# Patient Record
Sex: Female | Born: 1960
Health system: Southern US, Community
[De-identification: ages and names within clinical notes are randomized; demographics above are authoritative.]

## PROBLEM LIST (undated history)

## (undated) DIAGNOSIS — Z0000 Encounter for general adult medical examination without abnormal findings: Secondary | ICD-10-CM

## (undated) DIAGNOSIS — F32A Depression, unspecified: Secondary | ICD-10-CM

## (undated) DIAGNOSIS — F329 Major depressive disorder, single episode, unspecified: Secondary | ICD-10-CM

## (undated) DIAGNOSIS — IMO0002 Reserved for concepts with insufficient information to code with codable children: Secondary | ICD-10-CM

## (undated) DIAGNOSIS — Z86010 Personal history of colonic polyps: Secondary | ICD-10-CM

## (undated) DIAGNOSIS — D649 Anemia, unspecified: Secondary | ICD-10-CM

## (undated) DIAGNOSIS — E039 Hypothyroidism, unspecified: Secondary | ICD-10-CM

## (undated) DIAGNOSIS — M545 Low back pain: Secondary | ICD-10-CM

## (undated) DIAGNOSIS — J45909 Unspecified asthma, uncomplicated: Secondary | ICD-10-CM

## (undated) DIAGNOSIS — I10 Essential (primary) hypertension: Secondary | ICD-10-CM

## (undated) DIAGNOSIS — N870 Mild cervical dysplasia: Secondary | ICD-10-CM

## (undated) DIAGNOSIS — R430 Anosmia: Secondary | ICD-10-CM

## (undated) DIAGNOSIS — E785 Hyperlipidemia, unspecified: Secondary | ICD-10-CM

## (undated) DIAGNOSIS — M26609 Unspecified temporomandibular joint disorder, unspecified side: Secondary | ICD-10-CM

## (undated) HISTORY — DX: Depression, unspecified: F32.A

## (undated) HISTORY — DX: Anosmia: R43.0

## (undated) HISTORY — DX: Reserved for concepts with insufficient information to code with codable children: IMO0002

## (undated) HISTORY — DX: Encounter for general adult medical examination without abnormal findings: Z00.00

## (undated) HISTORY — PX: TONSILLECTOMY AND ADENOIDECTOMY: SUR1326

## (undated) HISTORY — DX: Anemia, unspecified: D64.9

## (undated) HISTORY — DX: Essential (primary) hypertension: I10

## (undated) HISTORY — DX: Unspecified temporomandibular joint disorder, unspecified side: M26.609

## (undated) HISTORY — DX: Low back pain: M54.5

## (undated) HISTORY — DX: Personal history of colonic polyps: Z86.010

## (undated) HISTORY — DX: Mild cervical dysplasia: N87.0

## (undated) HISTORY — DX: Hypothyroidism, unspecified: E03.9

## (undated) HISTORY — DX: Hyperlipidemia, unspecified: E78.5

## (undated) HISTORY — DX: Major depressive disorder, single episode, unspecified: F32.9

## (undated) HISTORY — DX: Unspecified asthma, uncomplicated: J45.909

---

## 1990-05-08 DIAGNOSIS — N870 Mild cervical dysplasia: Secondary | ICD-10-CM

## 1990-05-08 HISTORY — DX: Mild cervical dysplasia: N87.0

## 1994-09-06 HISTORY — PX: THYROIDECTOMY: SHX17

## 2001-12-02 ENCOUNTER — Other Ambulatory Visit: Admission: RE | Admit: 2001-12-02 | Discharge: 2001-12-02 | Payer: Self-pay | Admitting: Obstetrics and Gynecology

## 2002-12-03 ENCOUNTER — Ambulatory Visit (HOSPITAL_COMMUNITY): Admission: RE | Admit: 2002-12-03 | Discharge: 2002-12-03 | Payer: Self-pay | Admitting: Obstetrics and Gynecology

## 2002-12-03 ENCOUNTER — Encounter: Payer: Self-pay | Admitting: Obstetrics and Gynecology

## 2002-12-12 ENCOUNTER — Other Ambulatory Visit: Admission: RE | Admit: 2002-12-12 | Discharge: 2002-12-12 | Payer: Self-pay | Admitting: Obstetrics and Gynecology

## 2003-12-07 ENCOUNTER — Ambulatory Visit (HOSPITAL_COMMUNITY): Admission: RE | Admit: 2003-12-07 | Discharge: 2003-12-07 | Payer: Self-pay | Admitting: Obstetrics and Gynecology

## 2003-12-29 ENCOUNTER — Other Ambulatory Visit: Admission: RE | Admit: 2003-12-29 | Discharge: 2003-12-29 | Payer: Self-pay | Admitting: Obstetrics and Gynecology

## 2004-05-08 HISTORY — PX: CHOLECYSTECTOMY: SHX55

## 2004-05-08 HISTORY — PX: LAPAROSCOPIC CHOLECYSTECTOMY: SUR755

## 2004-12-09 ENCOUNTER — Ambulatory Visit (HOSPITAL_COMMUNITY): Admission: RE | Admit: 2004-12-09 | Discharge: 2004-12-09 | Payer: Self-pay | Admitting: Obstetrics and Gynecology

## 2005-01-31 ENCOUNTER — Other Ambulatory Visit: Admission: RE | Admit: 2005-01-31 | Discharge: 2005-01-31 | Payer: Self-pay | Admitting: Obstetrics and Gynecology

## 2005-12-26 ENCOUNTER — Ambulatory Visit (HOSPITAL_COMMUNITY): Admission: RE | Admit: 2005-12-26 | Discharge: 2005-12-26 | Payer: Self-pay | Admitting: Obstetrics and Gynecology

## 2006-05-16 ENCOUNTER — Other Ambulatory Visit: Admission: RE | Admit: 2006-05-16 | Discharge: 2006-05-16 | Payer: Self-pay | Admitting: Obstetrics and Gynecology

## 2007-01-01 ENCOUNTER — Encounter: Admission: RE | Admit: 2007-01-01 | Discharge: 2007-01-01 | Payer: Self-pay | Admitting: Obstetrics and Gynecology

## 2008-07-06 ENCOUNTER — Encounter: Admission: RE | Admit: 2008-07-06 | Discharge: 2008-07-06 | Payer: Self-pay | Admitting: Obstetrics and Gynecology

## 2008-07-08 ENCOUNTER — Other Ambulatory Visit: Admission: RE | Admit: 2008-07-08 | Discharge: 2008-07-08 | Payer: Self-pay | Admitting: Obstetrics and Gynecology

## 2008-07-20 ENCOUNTER — Ambulatory Visit (HOSPITAL_COMMUNITY): Admission: RE | Admit: 2008-07-20 | Discharge: 2008-07-20 | Payer: Self-pay | Admitting: Obstetrics and Gynecology

## 2008-07-20 HISTORY — PX: IUD REMOVAL: SHX5392

## 2008-08-05 DIAGNOSIS — E119 Type 2 diabetes mellitus without complications: Secondary | ICD-10-CM | POA: Insufficient documentation

## 2008-08-10 DIAGNOSIS — E559 Vitamin D deficiency, unspecified: Secondary | ICD-10-CM | POA: Insufficient documentation

## 2009-05-11 ENCOUNTER — Ambulatory Visit: Payer: Self-pay | Admitting: Family Medicine

## 2009-05-11 ENCOUNTER — Telehealth: Payer: Self-pay | Admitting: Family Medicine

## 2009-05-11 DIAGNOSIS — E1165 Type 2 diabetes mellitus with hyperglycemia: Secondary | ICD-10-CM | POA: Insufficient documentation

## 2009-05-11 DIAGNOSIS — E039 Hypothyroidism, unspecified: Secondary | ICD-10-CM | POA: Insufficient documentation

## 2009-05-11 DIAGNOSIS — IMO0002 Reserved for concepts with insufficient information to code with codable children: Secondary | ICD-10-CM | POA: Insufficient documentation

## 2009-05-11 DIAGNOSIS — E782 Mixed hyperlipidemia: Secondary | ICD-10-CM | POA: Insufficient documentation

## 2009-05-12 ENCOUNTER — Encounter: Payer: Self-pay | Admitting: Family Medicine

## 2009-05-12 LAB — CONVERTED CEMR LAB
Albumin: 4.7 g/dL (ref 3.5–5.2)
Alkaline Phosphatase: 105 units/L (ref 39–117)
BUN: 11 mg/dL (ref 6–23)
CO2: 22 meq/L (ref 19–32)
Calcium: 9.4 mg/dL (ref 8.4–10.5)
Chloride: 97 meq/L (ref 96–112)
Cholesterol: 298 mg/dL — ABNORMAL HIGH (ref 0–200)
Creatinine, Ser: 0.51 mg/dL (ref 0.40–1.20)
HCT: 45.1 % (ref 36.0–46.0)
HDL: 46 mg/dL (ref >39)
LDL Cholesterol: 213 mg/dL — ABNORMAL HIGH (ref 0–99)
Platelets: 306 10*3/uL (ref 150–400)
RDW: 13 % (ref 11.5–15.5)
Sodium: 136 meq/L (ref 135–145)
Total Bilirubin: 0.8 mg/dL (ref 0.3–1.2)
Total Protein: 7.4 g/dL (ref 6.0–8.3)
VLDL: 39 mg/dL (ref 0–40)
WBC: 10.5 10*3/uL (ref 4.0–10.5)

## 2009-05-13 DIAGNOSIS — R740 Nonspecific elevation of levels of transaminase and lactic acid dehydrogenase [LDH]: Secondary | ICD-10-CM

## 2009-05-13 DIAGNOSIS — R7401 Elevation of levels of liver transaminase levels: Secondary | ICD-10-CM | POA: Insufficient documentation

## 2009-05-21 ENCOUNTER — Encounter: Admission: RE | Admit: 2009-05-21 | Discharge: 2009-05-21 | Payer: Self-pay | Admitting: Family Medicine

## 2009-05-25 ENCOUNTER — Telehealth: Payer: Self-pay | Admitting: Family Medicine

## 2009-06-15 ENCOUNTER — Telehealth: Payer: Self-pay | Admitting: Family Medicine

## 2009-07-21 LAB — HM DIABETES EYE EXAM: HM Diabetic Eye Exam: NORMAL

## 2009-08-09 ENCOUNTER — Ambulatory Visit: Payer: Self-pay | Admitting: Family Medicine

## 2009-08-09 LAB — CONVERTED CEMR LAB: Microalbumin U total vol: 30 mg/L

## 2009-08-10 LAB — CONVERTED CEMR LAB
AST: 43 units/L — ABNORMAL HIGH (ref 0–37)
Bilirubin, Direct: 0.1 mg/dL (ref 0.0–0.3)
Indirect Bilirubin: 0.3 mg/dL (ref 0.0–0.9)
Total Bilirubin: 0.4 mg/dL (ref 0.3–1.2)

## 2009-08-16 ENCOUNTER — Encounter: Admission: RE | Admit: 2009-08-16 | Discharge: 2009-08-16 | Payer: Self-pay | Admitting: Obstetrics and Gynecology

## 2009-10-12 ENCOUNTER — Telehealth: Payer: Self-pay | Admitting: Family

## 2009-10-21 ENCOUNTER — Ambulatory Visit: Payer: Self-pay | Admitting: Family

## 2009-11-01 ENCOUNTER — Telehealth: Payer: Self-pay | Admitting: Family

## 2009-12-06 ENCOUNTER — Ambulatory Visit: Payer: Self-pay | Admitting: Family

## 2009-12-06 DIAGNOSIS — I10 Essential (primary) hypertension: Secondary | ICD-10-CM | POA: Insufficient documentation

## 2009-12-06 LAB — CONVERTED CEMR LAB: TSH: 0.401 microintl units/mL (ref 0.350–4.500)

## 2009-12-07 ENCOUNTER — Telehealth: Payer: Self-pay | Admitting: Family

## 2009-12-13 ENCOUNTER — Telehealth: Payer: Self-pay | Admitting: Family

## 2009-12-16 ENCOUNTER — Ambulatory Visit: Payer: Self-pay | Admitting: Family

## 2010-02-15 ENCOUNTER — Encounter: Payer: Self-pay | Admitting: Family

## 2010-05-11 ENCOUNTER — Ambulatory Visit
Admission: RE | Admit: 2010-05-11 | Discharge: 2010-05-11 | Payer: Self-pay | Source: Home / Self Care | Attending: Family | Admitting: Family

## 2010-05-11 ENCOUNTER — Encounter: Payer: Self-pay | Admitting: Family

## 2010-05-11 LAB — CONVERTED CEMR LAB
Albumin: 4.9 g/dL (ref 3.5–5.2)
Basophils Absolute: 0 10*3/uL (ref 0.0–0.1)
Chloride: 97 meq/L (ref 96–112)
Creatinine, Ser: 0.48 mg/dL (ref 0.40–1.20)
Creatinine, Urine: 163.7 mg/dL
Eosinophils Absolute: 0.2 10*3/uL (ref 0.0–0.7)
Eosinophils Relative: 2 % (ref 0–5)
Glucose, Bld: 133 mg/dL — ABNORMAL HIGH (ref 70–99)
HCT: 42.5 % (ref 36.0–46.0)
Hemoglobin: 14.6 g/dL (ref 12.0–15.0)
Lymphs Abs: 3.7 10*3/uL (ref 0.7–4.0)
MCHC: 34.4 g/dL (ref 30.0–36.0)
Microalb, Ur: 1.41 mg/dL (ref 0.00–1.89)
Neutro Abs: 7.7 10*3/uL (ref 1.7–7.7)
Potassium: 4.1 meq/L (ref 3.5–5.3)
RDW: 13.6 % (ref 11.5–15.5)
Sodium: 138 meq/L (ref 135–145)

## 2010-05-12 ENCOUNTER — Telehealth: Payer: Self-pay | Admitting: Family

## 2010-05-12 DIAGNOSIS — K7689 Other specified diseases of liver: Secondary | ICD-10-CM | POA: Insufficient documentation

## 2010-05-13 ENCOUNTER — Encounter: Payer: Self-pay | Admitting: Family

## 2010-05-13 LAB — CONVERTED CEMR LAB
LDL Cholesterol: 127 mg/dL — ABNORMAL HIGH (ref 0–99)
Triglycerides: 178 mg/dL — ABNORMAL HIGH (ref <150)
VLDL: 36 mg/dL (ref 0–40)

## 2010-05-17 ENCOUNTER — Telehealth: Payer: Self-pay | Admitting: Family

## 2010-06-07 NOTE — Progress Notes (Signed)
  Phone Note Call from Patient Call back at Work Phone [phone]   Caller: Patient Call For: Klark Vanderhoef Summary of Call: Pt called back after appointment today and states that she needs printed Rx's for her Prozac and it has to be name brand and the   Follow-up for Phone Call        That is fine.  Follow-up by: Nani Gasser MD,  May 11, 2009 4:18 PM     Appended Document:  Call pt: Old records did have a weight recorded on teh OV so I don't have one to compare. Also looked throught records and not given the pneumococcal vaccines to help protect against PNA. We can give at next OV. Then repeat again at age 50.  January  4, 20115:35 PM Killian Ress MD, Santina Evans  05/12/2009 @ 7:53am-Pt notified of above information. KJ LPN

## 2010-06-07 NOTE — Assessment & Plan Note (Signed)
Summary: NOV: DM, lipids   Vital Signs:  Patient profile:   50 year old female Height:      63.1 inches Weight:      210 pounds BMI:     37.22 Pulse rate:   84 / minute BP sitting:   130 / 81  (left arm) Cuff size:   regular  Vitals Entered By: Kathlene November (May 11, 2009 1:40 PM) CC: NP- get established Is Patient Diabetic? Yes Did you bring your meter with you today? No   CC:  NP- get established.  History of Present Illness: Has not taken her metfomrin 1000mg   for awhile and has been working out to trying  not to take [employer]. Also supposed to be on  pravastatin 40mg  but hasn't taken in months. Admits her diet is poor. She is due to check her thyroid as well.  Has had a hard time accepting that she has diabetes.   Habits & Providers  Alcohol-Tobacco-Diet     Alcohol drinks/day: <1     Tobacco Status: never  Exercise-Depression-Behavior     Does Patient Exercise: yes     Type of exercise: treadmill     Exercise (avg: min/session): 30-60     Times/week: 3     STD Risk: never     Drug Use: no     Seat Belt Use: always  Current Medications (verified): 1)  Levothyroxine Sodium 175 Mcg Tabs (Levothyroxine Sodium) .... Take One Tablet By Mouth Oncea  Day 2)  Triamterene-Hctz 37.5-25 Mg Tabs (Triamterene-Hctz) .... Take One Tablet By Mouth Once A Day 3)  Prozac 20 Mg Caps (Fluoxetine Hcl) .... Take One Tablet By Mouth Oncea  Day 4)  Zyrtec Allergy 10 Mg Caps (Cetirizine Hcl) .... Take One Tablet By Mouth 1-2 Times A Week  Allergies (verified): No Known Drug Allergies  Comments:  Nurse/Medical Assistant: The patient's medications and allergies were reviewed with the patient and were updated in the Medication and Allergy Lists. 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 Married to  [employer] with no kids.  Never Smoked Alcohol use-no Drug use-no Regular exercise-yes Smoking Status:  never Does Patient Exercise:  yes STD Risk:  never Drug Use:  no Seat Belt Use:  always  Physical Exam  General:  Well-developed,well-nourished,in no acute distress; alert,appropriate and cooperative throughout examination Head:  Normocephalic and atraumatic without obvious abnormalities. No apparent alopecia or balding. Neck:  No deformities, masses, or tenderness noted. Lungs:  Normal respiratory effort, chest expands symmetrically. Lungs are clear to auscultation, no crackles or wheezes. Heart:  Normal rate and regular rhythm. S1 and S2 normal without gallop, murmur, click, rub or other extra sounds. Skin:  no rashes.   Cervical Nodes:  No lymphadenopathy noted Psych:  Cognition and judgment appear intact. Alert and cooperative with normal attention span and concentration. No apparent delusions, illusions, hallucinations   Impression & Recommendations:  Problem # 1:  DIABETES MELLITUS, UNCONTROLLED (ICD-250.02) Clearly she is poorly controlled. I reviewed a chart to show here that on average she is running around 250 and warned her about the dangers of this.  Had her flu shot. Thinks she may have had her pnueomoccal but not sure. will check her records when I review them. Due to check for labwork to see baseline kidney function and cholesterol. For now  encouraged her to restart her metformina nd statin and as she works on diet and exercise we can wean the medication if indicated. I do think significant weight loss could make a big impact on these disease processes.  Since the metforming give her diarrhea discussed could lower the dose to 500 two times a day instead of 1000 and add januvia for maximal control. Samples given today. Call if any SE and if toelrating well then can send in rx to her pharmacy.   Orders: T-Lipid Profile [id number]) T-Comprehensive Metabolic Panel  ([id number]) T-CBC No Diff ([id number])  Problem # 2:  HYPERLIPIDEMIA (ICD-272.4) Due to recheck. She has been off her statin for several months. She was on pravastatin 40mg .   Complete Medication List: 1)  Levothyroxine Sodium 175 Mcg Tabs (Levothyroxine sodium) .... Take one tablet by mouth oncea  day 2)  Triamterene-hctz 37.5-25 Mg Tabs (Triamterene-hctz) .... Take one tablet by mouth once a day 3)  Prozac 20 Mg Caps (Fluoxetine hcl) .... Take one tablet by mouth oncea  day 4)  Zyrtec Allergy 10 Mg Caps (Cetirizine hcl) .... Take one tablet by mouth 1-2 times a week 5)  Zyrtec Allergy 10 Mg Tabs (Cetirizine hcl) .... Take 1 tablet by mouth once a day pr allergy symptoms 6)  Advil 200 Mg Tabs (Ibuprofen) .... 2-3 tabs by mouth up to 3 x a day as needed pain 7)  Tylenol Extra Strength 500 Mg Tabs (Acetaminophen) .Marland Kitchen.. 1-2 tabs by mouth every 4-6 hours as needed. 8)  Excedrin Pm 500-38 Mg Tabs (Diphenhydramine-apap (sleep)) .... One by mouth at bedtime as needed 9)  Glucose Monitor Strips.  .... Can test up to twice a day.  dx 250.00  Other Orders: T-TSH [id number])  Patient Instructions: 1)  Start Janumet 50/500 - one two times a day . If tolerate well and sugars are not going too low then let me know and we can send a rx to your pharmacy.  2)  Follow up in 6 weeks on the diabetes. Bring in glucose meter with her.  Prescriptions: TRIAMTERENE-HCTZ 37.5-25 MG TABS (TRIAMTERENE-HCTZ) Take one tablet by mouth once a day  #30 x 3   Entered by:   Kathlene November   Authorized by:   Nani Gasser MD   Signed by:   Kathlene November on 05/11/2009   Method used:   Print then Give to Patient   RxID:   [id number] PROZAC 20 MG CAPS (FLUOXETINE HCL) Take one tablet by mouth oncea  day Brand medically necessary #90 x 2   Entered by:   Kathlene November   Authorized by:   Nani Gasser MD   Signed by:   Kathlene November on 05/11/2009   Method used:   Print then Give to Patient   RxID:    [id number] GLUCOSE MONITOR STRIPS. Can test up to twice a day.  Dx 250.00  #60 x 11   Entered and Authorized by:   Nani Gasser MD   Signed by:   Nani Gasser MD on 05/11/2009   Method used:   Print then Give to Patient   RxID:   [id number] EXCEDRIN PM 500-38 MG TABS (DIPHENHYDRAMINE-APAP (SLEEP)) one by mouth at bedtime as needed  #30 x 3   Entered and Authorized by:   Nani Gasser MD   Signed by:   Nani Gasser MD on 05/11/2009   Method used:   Print then Give to Patient   RxID:   [id number] TYLENOL EXTRA STRENGTH 500 MG TABS (  ACETAMINOPHEN) 1-2 tabs by mouth every 4-6 hours as needed.  #60 x 3   Entered and Authorized by:   Nani Gasser MD   Signed by:   Nani Gasser MD on 05/11/2009   Method used:   Print then Give to Patient   RxID:   [id number] ADVIL 200 MG TABS (IBUPROFEN) 2-3 tabs by mouth up to 3 x a day as needed pain  #100 x 3   Entered and Authorized by:   Nani Gasser MD   Signed by:   Nani Gasser MD on 05/11/2009   Method used:   Print then Give to Patient   RxID:   [id number] ZYRTEC ALLERGY 10 MG TABS (CETIRIZINE HCL) Take 1 tablet by mouth once a day pr allergy symptoms  #30 x 11   Entered and Authorized by:   Nani Gasser MD   Signed by:   Nani Gasser MD on 05/11/2009   Method used:   Print then Give to Patient   RxID:   [id number]   Flu Vaccine Result Date:  03/08/2009 Flu Vaccine Result:  given Flu Vaccine Next Due:  1 yr TD Result Date:  05/08/2004 TD Result:  given TD Next Due:  10 yr

## 2010-06-07 NOTE — Progress Notes (Signed)
Summary: Needs Janumet  Phone Note Call from Patient   Summary of Call: Pt states given was given sample of Janumet 50/1000mg  from Dr. Linford Arnold and would like this called into pharmacy- I looked did not see this in chart. Pt is faxing over a copy of the packet she was given. Send med to [facility] in [place] Initial call taken by: Kathlene November,  October 12, 2009 2:49 PM  Follow-up for Phone Call        Rx sent to pharmacy.  She should follow up in 1 month.    New/Updated Medications: JANUMET 50-1000 MG TABS (SITAGLIPTIN-METFORMIN HCL) one tablet by mouth daily in the morning Prescriptions: JANUMET 50-1000 MG TABS (SITAGLIPTIN-METFORMIN HCL) one tablet by mouth daily in the morning  #30 x 0   Entered and Authorized by:   Lemont Fillers FNP   Signed by:   Lemont Fillers FNP on 10/12/2009   Method used:   Electronically to        [facility] [phone]* (retail)       [address] [place], [place]  [address]       Ph: [phone]       Fax: [phone]   RxID:   [id number]

## 2010-06-07 NOTE — Assessment & Plan Note (Signed)
Summary: DM, Thryoid, LFTs.    Vital Signs:  Patient profile:   50 year old female Height:      63.1 inches Weight:      213 pounds Pulse rate:   86 / minute BP sitting:   128 / 78  (left arm) Cuff size:   regular  Vitals Entered By: Kathlene November (August 09, 2009 8:18 AM) CC: followup diabetes Is Patient Diabetic? Yes Did you bring your meter with you today? Yes   CC:  followup diabetes.  Diabetes Management History:      The patient is a 50 years old female who comes in for evaluation of DM Type 2.  She states understanding of dietary principles but she is not following the appropriate diet.  She says that she is exercising.  Type of exercise includes: treadmill.  Duration of exercise is estimated to be 30-60  She is doing this 3 times per week.        Hypoglycemic symptoms are not occurring.  No hyperglycemic symptoms are reported.  Other comments include: Did start teh janument and is doing well on it. Says it is a little expensive though and would like a 90 days suppply. .        There are no symptoms to suggest diabetic complications.  Since her last visit, no infections have occurred.  No changes have been made to her treatment plan since last visit.        Her home blood sugars include fasting blood sugars: highest: 250, lowest: 150.    Current Medications (verified): 1)  Levothyroxine Sodium 200 Mcg Tabs (Levothyroxine Sodium) .... Take 1 Tablet By Mouth Once A Day 2)  Triamterene-Hctz 37.5-25 Mg Tabs (Triamterene-Hctz) .... Take One Tablet By Mouth Once A Day 3)  Prozac 20 Mg Caps (Fluoxetine Hcl) .... Take One Tablet By Mouth Oncea  Day 4)  Zyrtec Allergy 10 Mg Caps (Cetirizine Hcl) .... Take One Tablet By Mouth 1-2 Times A Week 5)  Advil 200 Mg Tabs (Ibuprofen) .... 2-3 Tabs By Mouth Up To 3 X A Day As Needed Pain 6)  Tylenol Extra Strength 500 Mg Tabs (Acetaminophen) .Marland Kitchen.. 1-2 Tabs By Mouth Every 4-6 Hours As Needed. 7)  Excedrin Pm 500-38 Mg Tabs (Diphenhydramine-Apap  (Sleep)) .... One By Mouth At Bedtime As Needed 8)  Glucose Monitor Strips. .... Can Test Up To Twice A Day.  Dx 250.00 9)  Janumet 50-500 Mg Tabs (Sitagliptin-Metformin Hcl) .... Take 1 Tablet By Mouth Two Times A Day 10)  Pravastatin Sodium 40 Mg Tabs (Pravastatin Sodium) .... Take One Tbalet By Mouth Oncea  Day 11)  Potassium Gluconate 595 Mg Tabs (Potassium Gluconate) .... Take One Tablet By Mouth Once A Day 12)  Calcium Carbonate 600 Mg Tabs (Calcium Carbonate) .... Take One Tablet By Mouth Twice A Day  Allergies (verified): No Known Drug Allergies   Impression & Recommendations:  Problem # 1:  DIABETES MELLITUS, UNCONTROLLED (ICD-250.02) she admits her diet isn't really better.  Brought in her glucometer and sugars are mostly running between 180-250. Since she is tolerateing the janumet well gave her sampls of the janumet 50/1000 two times a day. Explained that this by itself will not get her A1C down 3+ points so do recommend starting victoza or long acting insuling. Pt was very resistant to this and really wants to workon her diet and exercise.  She will call me if the janumet 50/1000 worked so can call in a new rx.  F/u in 3 months.   Her updated medication list for this problem includes:    Janumet 50-500 Mg Tabs (Sitagliptin-metformin hcl) .Marland Kitchen... Take 1 tablet by mouth two times a day  Orders: Fingerstick ([id number]) Creatinine  (47829) Hemoglobin A1C (56213) Urine Microalbumin (08657)  Labs Reviewed: Creat: 0.51 (05/11/2009)   Microalbumin: 30 (08/09/2009) Reviewed HgBA1c results: 10.6 (08/09/2009)  Problem # 2:  UNSPECIFIED HYPOTHYROIDISM (ICD-244.9) Due to recheck since we adjusted her dose at the last visit.  Her updated medication list for this problem includes:    Levothyroxine Sodium 200 Mcg Tabs (Levothyroxine sodium) .Marland Kitchen... Take 1 tablet by mouth once a day  Orders: T-TSH ([id number])  Problem # 3:  TRANSAMINASES, SERUM, ELEVATED (ICD-790.4) Likely from fatty  liver. Due to recheck.  Orders: T-Hepatic Function ([id number])  Complete Medication List: 1)  Levothyroxine Sodium 200 Mcg Tabs (Levothyroxine sodium) .... Take 1 tablet by mouth once a day 2)  Triamterene-hctz 37.5-25 Mg Tabs (Triamterene-hctz) .... Take one tablet by mouth once a day 3)  Prozac 20 Mg Caps (Fluoxetine hcl) .... Take one tablet by mouth oncea  day 4)  Zyrtec Allergy 10 Mg Caps (Cetirizine hcl) .... Take one tablet by mouth 1-2 times a week 5)  Advil 200 Mg Tabs (Ibuprofen) .... 2-3 tabs by mouth up to 3 x a day as needed pain 6)  Tylenol Extra Strength 500 Mg Tabs (Acetaminophen) .Marland Kitchen.. 1-2 tabs by mouth every 4-6 hours as needed. 7)  Excedrin Pm 500-38 Mg Tabs (Diphenhydramine-apap (sleep)) .... One by mouth at bedtime as needed 8)  Glucose Monitor Strips.  .... Can test up to twice a day.  dx 250.00 9)  Janumet 50-500 Mg Tabs (Sitagliptin-metformin hcl) .... Take 1 tablet by mouth two times a day 10)  Pravastatin Sodium 40 Mg Tabs (Pravastatin sodium) .... Take one tbalet by mouth oncea  day 11)  Potassium Gluconate 595 Mg Tabs (Potassium gluconate) .... Take one tablet by mouth once a day 12)  Calcium Carbonate 600 Mg Tabs (Calcium carbonate) .... Take one tablet by mouth twice a day  Patient Instructions: 1)  Please schedule a follow-up appointment in 3 months for diabetic follow up . 2)  We will call you with your lab results.   Prescriptions: JANUMET 50-500 MG TABS (SITAGLIPTIN-METFORMIN HCL) Take 1 tablet by mouth two times a day  #120 x 1   Entered and Authorized by:   Nani Gasser MD   Signed by:   Nani Gasser MD on 08/09/2009   Method used:   Electronically to        [facility] [phone]* (retail)       [address] [place], [place]  [address]       Ph: [phone]       Fax: [phone]   RxID:   [id number] PRAVASTATIN SODIUM 40 MG TABS (PRAVASTATIN SODIUM) Take one tbalet by mouth oncea  day  #90 x 0   Entered and Authorized by:    Nani Gasser MD   Signed by:   Nani Gasser MD on 08/09/2009   Method used:   Electronically to        [facility] [phone]* (retail)       [address] [place], [place]  [address]       Ph: [phone]       Fax: [phone]   RxID:   [id number] LEVOTHYROXINE SODIUM 200 MCG  TABS (LEVOTHYROXINE SODIUM) Take 1 tablet by mouth once a day  #30 x 1   Entered and Authorized by:   Nani Gasser MD   Signed by:   Nani Gasser MD on 08/09/2009   Method used:   Electronically to        [facility] [phone]* (retail)       [address] [place], [place]  [address]       Ph: [phone]       Fax: [phone]   RxID:   [id number] JANUMET 50-500 MG TABS (SITAGLIPTIN-METFORMIN HCL) Take 1 tablet by mouth two times a day  #60 x 2   Entered and Authorized by:   Nani Gasser MD   Signed by:   Nani Gasser MD on 08/09/2009   Method used:   Electronically to        [facility] [phone]* (retail)       [address] [place], [place]  [address]       Ph: [phone]       Fax: [phone]   RxID:   [id number] TRIAMTERENE-HCTZ 37.5-25 MG TABS (TRIAMTERENE-HCTZ) Take one tablet by mouth once a day  #90 x 3   Entered and Authorized by:   Nani Gasser MD   Signed by:   Nani Gasser MD on 08/09/2009   Method used:   Electronically to        [facility] [phone]* (retail)       [address] [place], [place]  [address]       Ph: [phone]       Fax: [phone]   RxID:   [id number]   Laboratory Results   Urine Tests  Date/Time Received: 08/09/2009 Date/Time Reported: 08/09/2009  Microalbumin (urine): 30 mg/L Creatinine: 100mg /dL  A:C Ratio 30mg /g  Blood Tests   Date/Time Received: 08/09/2009 Date/Time Reported: 08/09/2009  HGBA1C: 10.6%   (Normal Range: Non-Diabetic - 3-6%   Control Diabetic - 6-8%)

## 2010-06-07 NOTE — Progress Notes (Signed)
  Phone Note Outgoing Call   Summary of Call: Pls call patient and let her know that I would like her to complete some additional follow up lab work- orders placed in EMR.  She should plan to follow up with Dr. Linford Arnold in 1 month. Initial call taken by: Lemont Fillers FNP,  November 01, 2009 8:37 AM  Follow-up for Phone Call        Pt notified and said she has appt in 3 weeks and would like to wait and get done then. Follow-up by: Kathlene November,  November 01, 2009 8:50 AM

## 2010-06-07 NOTE — Assessment & Plan Note (Signed)
Summary: med refill/mhf--Rm 6   Vital Signs:  Patient profile:   50 year old female Height:      63.1 inches Weight:      207 pounds BMI:     36.68 Temp:     98.3 degrees F oral Pulse rate:   84 / minute Pulse rhythm:   regular Resp:     16 per minute BP sitting:   100 / 78  (right arm) Cuff size:   large  Vitals Entered By: Mervin Kung CMA Duncan Dull) (December 06, 2009 8:43 AM) CC: Room 6   Follow up, needs refills on :  Pravastatin and Janumet for 90 day supply to [facility]. Is Patient Diabetic? Yes   CC:  Room 6   Follow up and needs refills on :  Pravastatin and Janumet for 90 day supply to [facility]..  History of Present Illness: [name] is a 50 year old female who presents today for follow up of her diabetes.  She tells me that she wishes to continue her care at this office.  Last visit her Janumet was increased.   She notes that her fasting sugars have been hovering around 200.  Last A1C was 10.     Last eye exam was March 2011.  Never seen podiatry.  (declines podiatry- referral at this time). +nocturia 3x a night.  fasting sugars have been 170-200.    Allergies (verified): No Known Drug Allergies  Physical Exam  General:  Well-developed,well-nourished,in no acute distress; alert,appropriate and cooperative throughout examination Head:  Normocephalic and atraumatic without obvious abnormalities. No apparent alopecia or balding. Lungs:  Normal respiratory effort, chest expands symmetrically. Lungs are clear to auscultation, no crackles or wheezes. Heart:  Normal rate and regular rhythm. S1 and S2 normal without gallop, murmur, click, rub or other extra sounds.  Diabetes Management Exam:    Foot Exam (with socks and/or shoes not present):       Sensory-Monofilament:          Left foot: normal          Right foot: normal       Inspection:          Left foot: normal          Right foot: normal       Nails:          Left foot: normal          Right foot:  normal   Impression & Recommendations:  Problem # 1:  DIABETES MELLITUS, UNCONTROLLED (ICD-250.02) Assessment Comment Only Will check A1C and plan to adjust meds based on her A1C.  2 week supply of Janumet samples were provided to the patient.  Will add a baby aspirin.  Patient declines referral to podiatry at this time.   Her updated medication list for this problem includes:    Janumet 50-1000 Mg Tabs (Sitagliptin-metformin hcl) ..... One tablet by mouth twice daily    Aspirin 81 Mg Tbec (Aspirin) ..... One tablet by mouth daily    Lisinopril 5 Mg Tabs (Lisinopril) ..... One tablet by mouth daily  Problem # 2:  HYPERTENSION (ICD-401.9) Assessment: Comment Only Patient is not on an ACE inhibitor.  Will plan to d/c diuretic and place on a low dose ACE inhibitor for renal protection.  Monitor BP carefully.   The following medications were removed from the medication list:    Triamterene-hctz 37.5-25 Mg Tabs (Triamterene-hctz) .Marland Kitchen... Take one tablet by mouth once a day Her updated  medication list for this problem includes:    Lisinopril 5 Mg Tabs (Lisinopril) ..... One tablet by mouth daily  BP today: 100/78 Prior BP: 127/85 (10/21/2009)  Labs Reviewed: K+: 4.1 (05/11/2009) Creat: : 0.51 (05/11/2009)   Chol: 298 (05/11/2009)   HDL: 46 (05/11/2009)   LDL: 213 (05/11/2009)   TG: 193 (05/11/2009)  Problem # 3:  UNSPECIFIED HYPOTHYROIDISM (ICD-244.9) Assessment: Unchanged Will check TSH Her updated medication list for this problem includes:    Levothyroxine Sodium 200 Mcg Tabs (Levothyroxine sodium) .Marland Kitchen... Take 1 tablet by mouth once a day 5 days a week and 1 1/2 tabs by mouth on 2 days a week.  Orders: T-TSH [id number])  Labs Reviewed: TSH: 3.075 (08/09/2009)    HgBA1c: 10.6 (08/09/2009) Chol: 298 (05/11/2009)   HDL: 46 (05/11/2009)   LDL: 213 (05/11/2009)   TG: 193 (05/11/2009)  Complete Medication List: 1)  Levothyroxine Sodium 200 Mcg Tabs (Levothyroxine sodium) .... Take 1  tablet by mouth once a day 5 days a week and 1 1/2 tabs by mouth on 2 days a week. 2)  Prozac 20 Mg Caps (Fluoxetine hcl) .... Take one tablet by mouth oncea  day 3)  Zyrtec Allergy 10 Mg Caps (Cetirizine hcl) .... Take one tablet by mouth 1-2 times a week 4)  Advil 200 Mg Tabs (Ibuprofen) .... 2-3 tabs by mouth up to 3 x a day as needed pain 5)  Tylenol Extra Strength 500 Mg Tabs (Acetaminophen) .Marland Kitchen.. 1-2 tabs by mouth every 4-6 hours as needed. 6)  Excedrin Pm 500-38 Mg Tabs (Diphenhydramine-apap (sleep)) .... One by mouth at bedtime as needed 7)  Glucose Monitor Strips.  .... Can test up to twice a day.  dx 250.00 8)  Janumet 50-1000 Mg Tabs (Sitagliptin-metformin hcl) .... One tablet by mouth twice daily 9)  Pravastatin Sodium 40 Mg Tabs (Pravastatin sodium) .... Take one tbalet by mouth oncea  day 10)  Potassium Gluconate 595 Mg Tabs (Potassium gluconate) .... Take one tablet by mouth once a day 11)  Calcium Carbonate 600 Mg Tabs (Calcium carbonate) .... Take one tablet by mouth twice a day 12)  Aspirin 81 Mg Tbec (Aspirin) .... One tablet by mouth daily 13)  Lisinopril 5 Mg Tabs (Lisinopril) .... One tablet by mouth daily  Other Orders: T-Hgb A1C ([id number])  Patient Instructions: 1)  Please follow up in 1 month. 2)  Keep working hard on diet exercise, and weight loss. 3)  Stop triamterene/HCTZ start lisinopril. Prescriptions: PRAVASTATIN SODIUM 40 MG TABS (PRAVASTATIN SODIUM) Take one tbalet by mouth oncea  day  #90 x 0   Entered and Authorized by:   Lemont Fillers FNP   Signed by:   Lemont Fillers FNP on 12/06/2009   Method used:   Electronically to        [facility] [phone]* (retail)       [address] [place], [place]  [address]       Ph: [phone]       Fax: [phone]   RxID:   [id number] LISINOPRIL 5 MG TABS (LISINOPRIL) one tablet by mouth daily  #90 x 0   Entered and Authorized by:   Lemont Fillers FNP   Signed by:   Lemont Fillers FNP on 12/06/2009   Method used:   Electronically to        [facility] [phone]* (retail)       [address]       [place], [place]  [address]       Ph: [phone]       Fax: [phone]   RxID:   [id number] ASPIRIN 81 MG TBEC (ASPIRIN) one tablet by mouth daily  #90 x 3   Entered and Authorized by:   Lemont Fillers FNP   Signed by:   Lemont Fillers FNP on 12/06/2009   Method used:   Electronically to        [facility] [phone]* (retail)       [address] [place], [place]  [address]       Ph: [phone]       Fax: [phone]   RxID:   [id number]   Current Allergies (reviewed today): No known allergies

## 2010-06-07 NOTE — Assessment & Plan Note (Signed)
Summary: DISCUSS MEDS//VGJ   Vital Signs:  Patient profile:   50 year old female Height:      63.1 inches Weight:      213 pounds Pulse rate:   94 / minute BP sitting:   127 / 85  (left arm) Cuff size:   regular  Vitals Entered By: Kathlene November (October 21, 2009 4:20 PM) CC: discuss Janumet dose   CC:  discuss Janumet dose.  History of Present Illness: Colleen Baldwin is a 50 year old female who presents today for follow up of her diabetes and to request clarification and refill on her Janumet.  She was previously treated with Janumet 50/500 BID and reports that she had sugars in the mid to high 200's on this regimen. She reports that last visit Dr. Linford Arnold gave her a sample of Janumet 50/1000 to be taken on a BID basis. In addition, she was taking Janumet 50/500 in the PM but this ran out.     She ran out of the 50/500 and has not been taking any med in the PM.   Since she discontinued the PM dose she is seeing some sugars in the 300's.  Patient admits to poor dietary compliance.  Notes + nocturia.  + polyuria/dry mouth.  Denies blurred vision.    Current Medications (verified): 1)  Levothyroxine Sodium 200 Mcg Tabs (Levothyroxine Sodium) .... Take 1 Tablet By Mouth Once A Day 5 Days A Week and 1 1/2 Tabs By Mouth On 2 Days A Week. 2)  Triamterene-Hctz 37.5-25 Mg Tabs (Triamterene-Hctz) .... Take One Tablet By Mouth Once A Day 3)  Prozac 20 Mg Caps (Fluoxetine Hcl) .... Take One Tablet By Mouth Oncea  Day 4)  Zyrtec Allergy 10 Mg Caps (Cetirizine Hcl) .... Take One Tablet By Mouth 1-2 Times A Week 5)  Advil 200 Mg Tabs (Ibuprofen) .... 2-3 Tabs By Mouth Up To 3 X A Day As Needed Pain 6)  Tylenol Extra Strength 500 Mg Tabs (Acetaminophen) .Marland Kitchen.. 1-2 Tabs By Mouth Every 4-6 Hours As Needed. 7)  Excedrin Pm 500-38 Mg Tabs (Diphenhydramine-Apap (Sleep)) .... One By Mouth At Bedtime As Needed 8)  Glucose Monitor Strips. .... Can Test Up To Twice A Day.  Dx 250.00 9)  Janumet 50-1000 Mg Tabs  (Sitagliptin-Metformin Hcl) .... One Tablet By Mouth Daily in The Morning 10)  Pravastatin Sodium 40 Mg Tabs (Pravastatin Sodium) .... Take One Tbalet By Mouth Oncea  Day 11)  Potassium Gluconate 595 Mg Tabs (Potassium Gluconate) .... Take One Tablet By Mouth Once A Day 12)  Calcium Carbonate 600 Mg Tabs (Calcium Carbonate) .... Take One Tablet By Mouth Twice A Day  Allergies (verified): No Known Drug Allergies  Comments:  Nurse/Medical Assistant: The patient's medications and allergies were reviewed with the patient and were updated in the Medication and Allergy Lists. Kathlene November (October 21, 2009 4:20 PM)  Physical Exam  General:  Well-developed,well-nourished,in no acute distress; alert,appropriate and cooperative throughout examination Lungs:  Normal respiratory effort, chest expands symmetrically. Lungs are clear to auscultation, no crackles or wheezes. Heart:  Normal rate and regular rhythm. S1 and S2 normal without gallop, murmur, click, rub or other extra sounds.   Impression & Recommendations:  Problem # 1:  DIABETES MELLITUS, UNCONTROLLED (ICD-250.02) Assessment Deteriorated Will increase Janumet to 50/1000 mg by mouth two times a day.  It does not appear that she ever took the higher dose of Janumet two times a day as was noted in her plan from  Dr. Shelah Lewandowsky notes in April.  I emphasized the importance of dietary compliance and weight loss with the patient as well as the long term risks and complications of uncontrolled diabetes.  Will plan to bring patient back in 1 month.  If no improvement at that time will plan to initiate either Januvia or Lantus depending on her progress and A1C.   Her updated medication list for this problem includes:    Janumet 50-1000 Mg Tabs (Sitagliptin-metformin hcl) ..... One tablet by mouth twice daily  Labs Reviewed: Creat: 0.51 (05/11/2009)   Microalbumin: 30 (08/09/2009) Reviewed HgBA1c results: 10.6 (08/09/2009)  Complete Medication  List: 1)  Levothyroxine Sodium 200 Mcg Tabs (Levothyroxine sodium) .... Take 1 tablet by mouth once a day 5 days a week and 1 1/2 tabs by mouth on 2 days a week. 2)  Triamterene-hctz 37.5-25 Mg Tabs (Triamterene-hctz) .... Take one tablet by mouth once a day 3)  Prozac 20 Mg Caps (Fluoxetine hcl) .... Take one tablet by mouth oncea  day 4)  Zyrtec Allergy 10 Mg Caps (Cetirizine hcl) .... Take one tablet by mouth 1-2 times a week 5)  Advil 200 Mg Tabs (Ibuprofen) .... 2-3 tabs by mouth up to 3 x a day as needed pain 6)  Tylenol Extra Strength 500 Mg Tabs (Acetaminophen) .Marland Kitchen.. 1-2 tabs by mouth every 4-6 hours as needed. 7)  Excedrin Pm 500-38 Mg Tabs (Diphenhydramine-apap (sleep)) .... One by mouth at bedtime as needed 8)  Glucose Monitor Strips.  .... Can test up to twice a day.  dx 250.00 9)  Janumet 50-1000 Mg Tabs (Sitagliptin-metformin hcl) .... One tablet by mouth twice daily 10)  Pravastatin Sodium 40 Mg Tabs (Pravastatin sodium) .... Take one tbalet by mouth oncea  day 11)  Potassium Gluconate 595 Mg Tabs (Potassium gluconate) .... Take one tablet by mouth once a day 12)  Calcium Carbonate 600 Mg Tabs (Calcium carbonate) .... Take one tablet by mouth twice a day  Patient Instructions: 1)  Please follow up in 1 month. 2)  Work hard on your diabetic diet and exercise.  Prescriptions: JANUMET 50-1000 MG TABS (SITAGLIPTIN-METFORMIN HCL) one tablet by mouth twice daily  #60 x 0   Entered and Authorized by:   Lemont Fillers FNP   Signed by:   Lemont Fillers FNP on 10/21/2009   Method used:   Electronically to        [facility] [phone]* (retail)       [address] [place], [place]  [address]       Ph: [phone]       Fax: [phone]   RxID:   [id number]

## 2010-06-07 NOTE — Progress Notes (Signed)
Summary: refill--loevthyroxine  Phone Note Refill Request Message from:  Patient on December 13, 2009 10:25 AM  Refills Requested: Medication #1:  LEVOTHYROXINE SODIUM 200 MCG TABS Take 1 tablet by mouth once a day 5 days a week and 1 1/2 tabs by mouth on 2 days a week.   Dosage confirmed as above?Dosage Confirmed   Supply Requested: 90 days Next Appointment Scheduled: 01/07/10 O'Sullivan,NP Initial call taken by: Mervin Kung CMA Duncan Dull),  December 13, 2009 10:26 AM  Follow-up for Phone Call        Left message on machine on voicemail that request had been completed, no changes needed and to call if has any questions.  Nicki Guadalajara Fergerson CMA (AAMA)  December 13, 2009 10:30 AM     Prescriptions: LEVOTHYROXINE SODIUM 200 MCG TABS (LEVOTHYROXINE SODIUM) Take 1 tablet by mouth once a day 5 days a week and 1 1/2 tabs by mouth on 2 days a week.  #105 x 1   Entered by:   Mervin Kung CMA (AAMA)   Authorized by:   Lemont Fillers FNP   Signed by:   Mervin Kung CMA (AAMA) on 12/13/2009   Method used:   Electronically to        [facility] [phone]* (retail)       [address] [place], [place]  [address]       Ph: [phone]       Fax: [phone]   RxID:   [id number]

## 2010-06-07 NOTE — Progress Notes (Signed)
Summary: Janumet rx  Phone Note Outgoing Call Call back at Work Phone [phone]   Summary of Call: Patient notified of u/s results and would like prescription for Janumet sent to [facility] on [address] in [place]. Per the patient this is much better than the previous med. Initial call taken by: Lucious Groves,  May 25, 2009 1:00 PM    New/Updated Medications: JANUMET 50-500 MG TABS (SITAGLIPTIN-METFORMIN HCL) Take 1 tablet by mouth two times a day Prescriptions: JANUMET 50-500 MG TABS (SITAGLIPTIN-METFORMIN HCL) Take 1 tablet by mouth two times a day  #60 x 2   Entered and Authorized by:   Nani Gasser MD   Signed by:   Nani Gasser MD on 05/25/2009   Method used:   Electronically to        [facility] [phone]* (retail)       [address] [place], [place]  [address]       Ph: [phone]       Fax: [phone]   RxID:   [id number]   Appended Document: Janumet rx Patient notified.

## 2010-06-07 NOTE — Letter (Signed)
Summary: Records Dated 05-09-02 thru 08-05-08/[facility]  Records Dated 05-09-02 thru 08-05-08/[facility]   Imported By: Lanelle Bal 05/21/2009 10:22:37  _____________________________________________________________________  External Attachment:    Type:   Image     Comment:   External Document

## 2010-06-07 NOTE — Progress Notes (Signed)
Summary: FYI;  PT WANTS TO STAY WITH BRAND NAME PROZAC  Phone Note Call from Patient Call back at Work Phone [phone]   Caller: Patient Summary of Call: pt called wanted to give a heads up will get a notice from [facility], they will ask for a generic for Prozac, AND I WANT TO STAY WITH THE BRAND NAME,  aware may have to pay more and OK with that.   Initial call taken by: Kandice Hams,  June 15, 2009 8:54 AM

## 2010-06-07 NOTE — Assessment & Plan Note (Signed)
Summary: nurse visit for victoza teaching / tf,cma  Nurse Visit   Allergies: No Known Drug Allergies

## 2010-06-07 NOTE — Progress Notes (Signed)
Summary: lab results  Phone Note Outgoing Call   Summary of Call: Please call patient and let her know that I have reviewed her A1C (9.6) and fasting sugars are not yet at goal. I would like to have her stop Janumet and instead start plain metformin and victoza.  Please have her return for nurse visit for Victoza teaching.  She should continue Janumet until this visit.   She will need a full follow up visit in 1 month please. She should bring victoza rx to her visit I have sent to her pharmacy. Initial call taken by: Lemont Fillers FNP,  December 07, 2009 9:54 AM  Follow-up for Phone Call        Pt advised per [facility] instructions.  Nurse visit scheduled for 12/16/09 @ 1:15pm.  Pt has f/u with melissa on 01/07/10. Nicki Guadalajara Fergerson CMA (AAMA)  December 07, 2009 1:56 PM     New/Updated Medications: METFORMIN HCL 1000 MG TABS (METFORMIN HCL) one tablet by mouth two times a day VICTOZA 18 MG/3ML SOLN (LIRAGLUTIDE) Start: 0.6 mg subcutaneous injection x1wk, then increase to 1.2 mg injection once daily BD PEN NEEDLE SHORT U/F 31G X 8 MM MISC (INSULIN PEN NEEDLE) use as directed Prescriptions: BD PEN NEEDLE SHORT U/F 31G X 8 MM MISC (INSULIN PEN NEEDLE) use as directed  #1 box x 2   Entered and Authorized by:   Lemont Fillers FNP   Signed by:   Lemont Fillers FNP on 12/07/2009   Method used:   Electronically to        [facility] [phone]* (retail)       [address] [place], [place]  [address]       Ph: [phone]       Fax: [phone]   RxID:   [id number] VICTOZA 18 MG/3ML SOLN (LIRAGLUTIDE) Start: 0.6 mg subcutaneous injection x1wk, then increase to 1.2 mg injection once daily  #1 x 2   Entered and Authorized by:   Lemont Fillers FNP   Signed by:   Lemont Fillers FNP on 12/07/2009   Method used:   Electronically to        [facility] [phone]* (retail)       [address] [place], [place]  [address]       Ph: [phone]       Fax:  [phone]   RxID:   [id number] METFORMIN HCL 1000 MG TABS (METFORMIN HCL) one tablet by mouth two times a day  #60 x 2   Entered and Authorized by:   Lemont Fillers FNP   Signed by:   Lemont Fillers FNP on 12/07/2009   Method used:   Electronically to        [facility] [phone]* (retail)       [address] [place], [place]  [address]       Ph: [phone]       Fax: [phone]   RxID:   [id number]

## 2010-06-09 NOTE — Progress Notes (Signed)
Summary: lab results  Phone Note Outgoing Call   Call placed by: Lemont Fillers FNP,  May 12, 2010 8:43 PM Call placed to: Patient Details for Reason: discuss lab work Summary of Call: Left message for patient to return my call.  When patient calls back, please let her know that her A1C is slightly improved- but will plan to Increase victoza to 1.8, continue synthroid at current dose (please verify dose that patient is actually taking and update in med list).  Also- cholesterol is improved, but not at goal.  I would like her to switch from pravastatin to crestor (sent to pharmacy) Needs f/u FLP, LFT's in 3 months.(272.4)  She needs to work hard on low fat/low cholesterol diet, exercise, and weight loss.  Initial call taken by: Lemont Fillers FNP,  May 12, 2010 8:43 PM  Follow-up for Phone Call        Left message on machine to return my call. Nicki Guadalajara Fergerson CMA (AAMA)  May 16, 2010 10:10 AM  Pt returned my call asking that I call her back at [phone]. Attempted to reach pt but had to leave message on voicemail to return my call. Nicki Guadalajara Fergerson CMA Duncan Dull)  May 16, 2010 4:55 PM    Additional Follow-up for Phone Call Additional follow up Details #1::        Left message on machine to return my call. Nicki Guadalajara Fergerson CMA Duncan Dull)  May 17, 2010 8:34 AM   Pt notified. Pt states she has been taking Synthroid 1 tablet daily. Med list has been corrected.  F/u appt scheduled for 08/17/10 @ 11:15. Pt will go to lab the week prior. Lab order entered and faxed to the lab. Nicki Guadalajara Fergerson CMA Duncan Dull)  May 17, 2010 10:35 AM   New Problems: FATTY LIVER DISEASE (ICD-571.8)   New Problems: FATTY LIVER DISEASE (ICD-571.8) New/Updated Medications: LEVOTHYROXINE SODIUM 200 MCG TABS (LEVOTHYROXINE SODIUM) Take 1 tablet by mouth once a day. CRESTOR 10 MG TABS (ROSUVASTATIN CALCIUM) one tablet by mouth daily VICTOZA 18 MG/3ML SOLN (LIRAGLUTIDE) 1.8 mg injection once  daily Prescriptions: CRESTOR 10 MG TABS (ROSUVASTATIN CALCIUM) one tablet by mouth daily  #30 x 2   Entered and Authorized by:   Lemont Fillers FNP   Signed by:   Lemont Fillers FNP on 05/13/2010   Method used:   Electronically to        [facility] [phone]* (retail)       [address] [place], [place]  [address]       Ph: [phone]       Fax: [phone]   RxID:   [id number] LEVOTHYROXINE SODIUM 200 MCG TABS (LEVOTHYROXINE SODIUM) Take 1 tablet by mouth once a day 5 days a week and 1 1/2 tabs by mouth on 2 days a week.  #105 x 1   Entered and Authorized by:   Lemont Fillers FNP   Signed by:   Lemont Fillers FNP on 05/13/2010   Method used:   Electronically to        [facility] [phone]* (retail)       [address] [place], [place]  [address]       Ph: [phone]       Fax: [phone]   RxID:   [id number] VICTOZA 18 MG/3ML SOLN (LIRAGLUTIDE) 1.8 mg injection once daily  #1 x  2   Entered and Authorized by:   Lemont Fillers FNP   Signed by:   Lemont Fillers FNP on 05/13/2010   Method used:   Electronically to        [facility] [phone]* (retail)       [address] [place], [place]  [address]       Ph: [phone]       Fax: [phone]   RxID:   [id number]

## 2010-06-09 NOTE — Medication Information (Signed)
Summary: Nonadherence with Metformin/[facility]  Nonadherence with Metformin/[facility]   Imported By: Lanelle Bal 05/13/2010 08:22:19  _____________________________________________________________________  External Attachment:    Type:   Image     Comment:   External Document

## 2010-06-09 NOTE — Assessment & Plan Note (Signed)
Summary: cpx/mhf--rm 4   Vital Signs:  Patient profile:   50 year old female Height:      63.1 inches Weight:      209.50 pounds BMI:     37.13 Temp:     97.6 degrees F oral Pulse rate:   90 / minute Pulse rhythm:   regular Resp:     16 per minute BP sitting:   120 / 90  (right arm) Cuff size:   large  Vitals Entered By: Mervin Kung CMA Duncan Dull) (May 11, 2010 11:15 AM) CC: Pt here for physical. Is Patient Diabetic? Yes Pain Assessment Patient in pain? no      Comments Pt has been taking Levothyroxine 1 daily. Potassium Two tablets every morning. Pt needs refills on all meds for 90 day supply. Pt uses only brand name for Prozac. Also needs RX for advil, excedrin PM and Zyrtec for Flex Spending acct. Nicki Guadalajara Fergerson CMA Duncan Dull)  May 11, 2010 11:28 AM    Primary Care Provider:  Lemont Fillers FNP  CC:  Pt here for physical..  History of Present Illness: Ms.  Baldwin is a 50 year old female who presents today for a CPX.  Preventative- has never received pneumovax.  Started back to the gym last night.   Pap/Mammo up to date per patient and are being managed by GYN. No family hx of colon cancer.  DM2-  Notes that she "fell off of the wagon" over the holidays.  Now on plain metformin and Victoza.  Feels that Victoza has diminished her appetite in a positive way.  Reports that her sugar was 162 today.    Preventive Screening-Counseling & Management  Alcohol-Tobacco     Alcohol drinks/day: <1     Smoking Status: never  Caffeine-Diet-Exercise     Does Patient Exercise: yes     Type of exercise: treadmill     Exercise (avg: min/session): 30-60     Times/week: 3  Allergies (verified): No Known Drug Allergies  Past History:  Past Medical History: HTN Hypothyroidism- s/p thyroidectomy for goiter DM2- uncontrolled Hyperlipidemia  Past Surgical History: thyroidectomy 09/1994 Cholecystectomy 05/2004 IUD removal 2010  Family History: Reviewed history  from 05/11/2009 and no changes required. GM with DM Mom- living, ? hx, ? uterine cancer underwent radiation (estranged) Dad- thyroid disease, DM (estranged) 2 sisters- living- older sister with HTN(estanged) Younger sister- alive and well  Social History: Reviewed history from 05/11/2009 and no changes required. 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 Married to [employer] with no kids.  Never Smoked Alcohol use-no Drug use-no Regular exercise-yes  Review of Systems       Constitutional: Denies Fever ENT:  Denies nasal congestion or sore throat. Resp: Denies cough CV:  Denies Chest Pain GI:  Denies nausea or vomitting- occasional stomach pain.  very infrequent GU: Denies dysuria, or frequency Lymphatic: Denies lymphadenopathy Musculoskeletal:  L elbow pain since the fall Skin:  Denies Rashes or lesions Psychiatric: Denies depression- does not take prozac every day Neuro: Denies numbness     Physical Exam  General:  overweight white female, awake, alert, NAD Head:  Normocephalic and atraumatic without obvious abnormalities. No apparent alopecia or balding. Eyes:  PERRLA, sclera are clear Ears:  + cerumen noted bilaterally.  TM's intact Mouth:  Oral mucosa and oropharynx without lesions or exudates.  Teeth in good repair. Neck:  No deformities, masses, or tenderness noted. Breasts:  deferred to GYN Lungs:  Normal respiratory effort, chest  expands symmetrically. Lungs are clear to auscultation, no crackles or wheezes. Heart:  Normal rate and regular rhythm. S1 and S2 normal without gallop, murmur, click, rub or other extra sounds. Abdomen:  Bowel sounds positive,abdomen soft and non-tender without masses, organomegaly or hernias noted. Genitalia:  deferred to GYN Msk:  Mild swelling noted of L elbow, no erythema, non-tender full ROM.   Extremities:  No clubbing, cyanosis, edema, or deformity noted with normal full range of motion of all joints.   Neurologic:   No cranial nerve deficits noted. Station and gait are normal. Plantar reflexes are down-going bilaterally. DTRs are symmetrical throughout. Sensory, motor and coordinative functions appear intact. Skin:  Intact without suspicious lesions or rashes Cervical Nodes:  No lymphadenopathy noted Psych:  Cognition and judgment appear intact. Alert and cooperative with normal attention span and concentration. No apparent delusions, illusions, hallucinations  Diabetes Management Exam:    Eye Exam:       Eye Exam done elsewhere          Date: 07/21/2009          Results: normal          Done by: Pilar Grammes- [facility]   Impression & Recommendations:  Problem # 1:  Preventive Health Care (ICD-V70.0) Assessment Comment Only Patient was counseled on diet, exercise and weight loss.  (specifically diabetic diet) Pneumovax given today.    Problem # 2:  DIABETES MELLITUS, UNCONTROLLED (ICD-250.02) Assessment: Unchanged A1C is down to 8.  Will increase her victoza to 1.8.  Long discussion with patient on importance of dietary and med compliance and long term risks of uncontrolled diabetes.  DM ed material was provided to the patient.  Her updated medication list for this problem includes:    Metformin Hcl 1000 Mg Tabs (Metformin hcl) ..... One tablet by mouth two times a day    Aspirin 81 Mg Tbec (Aspirin) ..... One tablet by mouth daily    Lisinopril 5 Mg Tabs (Lisinopril) ..... One tablet by mouth daily    Victoza 18 Mg/11ml Soln (Liraglutide) ..... Start: 0.6 mg subcutaneous injection x1wk, then increase to 1.2 mg injection once daily  Orders: T-Hgb A1C (16[address]-60454) T-Microalbumin Urine [facility]) (82043-MALBCR)  Labs Reviewed: Creat: 0.51 (05/11/2009)   Microalbumin: 30 (08/09/2009)  Last Eye Exam: normal (07/21/2009) Reviewed HgBA1c results: 9.6 (12/06/2009)  10.6 (08/09/2009)  Problem # 3:  UNSPECIFIED HYPOTHYROIDISM (ICD-244.9) Assessment: Improved TSH is normal, continue current  dose levothyroxine. Her updated medication list for this problem includes:    Levothyroxine Sodium 200 Mcg Tabs (Levothyroxine sodium) .Marland Kitchen... Take 1 tablet by mouth once a day 5 days a week and 1 1/2 tabs by mouth on 2 days a week.  Orders: TLB-TSH (Thyroid Stimulating Hormone) (84443-TSH)  Complete Medication List: 1)  Levothyroxine Sodium 200 Mcg Tabs (Levothyroxine sodium) .... Take 1 tablet by mouth once a day 5 days a week and 1 1/2 tabs by mouth on 2 days a week. 2)  Prozac 20 Mg Caps (Fluoxetine hcl) .... Take one tablet by mouth oncea  day 3)  Zyrtec Allergy 10 Mg Caps (Cetirizine hcl) .... Take one tablet by mouth 1-2 times a week 4)  Advil 200 Mg Tabs (Ibuprofen) .... 2-3 tabs by mouth up to 3 x a day as needed pain 5)  Tylenol Extra Strength 500 Mg Tabs (Acetaminophen) .Marland Kitchen.. 1-2 tabs by mouth every 4-6 hours as needed. 6)  Excedrin Pm 500-38 Mg Tabs (Diphenhydramine-apap (sleep)) .... One by mouth at  bedtime as needed 7)  Glucose Monitor Strips.  .... Can test up to twice a day.  dx 250.00 8)  Metformin Hcl 1000 Mg Tabs (Metformin hcl) .... One tablet by mouth two times a day 9)  Pravastatin Sodium 40 Mg Tabs (Pravastatin sodium) .... Take one tbalet by mouth oncea  day 10)  Potassium Gluconate 595 Mg Tabs (Potassium gluconate) .... Take one tablet by mouth once a day 11)  Calcium Carbonate 600 Mg Tabs (Calcium carbonate) .... Take one tablet by mouth twice a day 12)  Aspirin 81 Mg Tbec (Aspirin) .... One tablet by mouth daily 13)  Lisinopril 5 Mg Tabs (Lisinopril) .... One tablet by mouth daily 14)  Victoza 18 Mg/59ml Soln (Liraglutide) .... Start: 0.6 mg subcutaneous injection x1wk, then increase to 1.2 mg injection once daily 15)  Bd Pen Needle Short U/f 31g X 8 Mm Misc (Insulin pen needle) .... Use as directed  Other Orders: TLB-BMP (Basic Metabolic Panel-BMET) (80048-METABOL) TLB-CBC Platelet - w/Differential (85025-CBCD) TLB-Hepatic/Liver Function Pnl  (80076-HEPATIC) Pneumococcal Vaccine (04540) Admin 1st Vaccine (98119)  Patient Instructions: 1)  Please complete your  blood work and urine on the first floor today. 2)  Please visit the American Diabetes Association web page for information on diabetic diet.   3)  Work hard on diet, exercise and weight loss. 4)  Follow up in 3 months. Prescriptions: POTASSIUM GLUCONATE 595 MG TABS (POTASSIUM GLUCONATE) Take one tablet by mouth once a day  #1 bottle x 56f   Entered and Authorized by:   Lemont Fillers FNP   Signed by:   Lemont Fillers FNP on 05/11/2010   Method used:   Print then Give to Patient   RxID:   [id number] BD PEN NEEDLE SHORT U/F 31G X 8 MM MISC (INSULIN PEN NEEDLE) use as directed  #1 box x 2   Entered and Authorized by:   Lemont Fillers FNP   Signed by:   Lemont Fillers FNP on 05/11/2010   Method used:   Print then Give to Patient   RxID:   [id number] VICTOZA 18 MG/3ML SOLN (LIRAGLUTIDE) Start: 0.6 mg subcutaneous injection x1wk, then increase to 1.2 mg injection once daily  #3 x 1   Entered and Authorized by:   Lemont Fillers FNP   Signed by:   Lemont Fillers FNP on 05/11/2010   Method used:   Print then Give to Patient   RxID:   [id number] LISINOPRIL 5 MG TABS (LISINOPRIL) one tablet by mouth daily  #90 x 1   Entered and Authorized by:   Lemont Fillers FNP   Signed by:   Lemont Fillers FNP on 05/11/2010   Method used:   Print then Give to Patient   RxID:   [id number] ASPIRIN 81 MG TBEC (ASPIRIN) one tablet by mouth daily  #90 x 3   Entered and Authorized by:   Lemont Fillers FNP   Signed by:   Lemont Fillers FNP on 05/11/2010   Method used:   Print then Give to Patient   RxID:   [id number] PRAVASTATIN SODIUM 40 MG TABS (PRAVASTATIN SODIUM) Take one tbalet by mouth oncea  day  #90 x 1   Entered and Authorized by:   Lemont Fillers FNP   Signed by:   Lemont Fillers FNP  on 05/11/2010   Method used:   Print then Give to Patient   RxID:   [id number] METFORMIN HCL 1000 MG TABS (  METFORMIN HCL) one tablet by mouth two times a day  #180 x 1   Entered and Authorized by:   Lemont Fillers FNP   Signed by:   Lemont Fillers FNP on 05/11/2010   Method used:   Print then Give to Patient   RxID:   [id number] GLUCOSE MONITOR STRIPS. Can test up to twice a day.  Dx 250.00  #60 x 11   Entered and Authorized by:   Lemont Fillers FNP   Signed by:   Lemont Fillers FNP on 05/11/2010   Method used:   Print then Give to Patient   RxID:   [id number] PROZAC 20 MG CAPS (FLUOXETINE HCL) Take one tablet by mouth oncea  day Brand medically necessary #90 x 1   Entered and Authorized by:   Lemont Fillers FNP   Signed by:   Lemont Fillers FNP on 05/11/2010   Method used:   Print then Give to Patient   RxID:   [id number] TYLENOL EXTRA STRENGTH 500 MG TABS (ACETAMINOPHEN) 1-2 tabs by mouth every 4-6 hours as needed.  #1 bottle x 6   Entered and Authorized by:   Lemont Fillers FNP   Signed by:   Lemont Fillers FNP on 05/11/2010   Method used:   Print then Give to Patient   RxID:   [id number] CALCIUM CARBONATE 600 MG TABS (CALCIUM CARBONATE) Take one tablet by mouth twice a day  #1 bottle x 11   Entered and Authorized by:   Lemont Fillers FNP   Signed by:   Lemont Fillers FNP on 05/11/2010   Method used:   Print then Give to Patient   RxID:   [id number] EXCEDRIN PM 500-38 MG TABS (DIPHENHYDRAMINE-APAP (SLEEP)) one by mouth at bedtime as needed  #1 bottle x 6   Entered and Authorized by:   Lemont Fillers FNP   Signed by:   Lemont Fillers FNP on 05/11/2010   Method used:   Print then Give to Patient   RxID:   [id number] ADVIL 200 MG TABS (IBUPROFEN) 2-3 tabs by mouth up to 3 x a day as needed pain  #1 bottle x 6   Entered and Authorized by:   Lemont Fillers FNP    Signed by:   Lemont Fillers FNP on 05/11/2010   Method used:   Print then Give to Patient   RxID:   [id number] ZYRTEC ALLERGY 10 MG CAPS (CETIRIZINE HCL) Take one tablet by mouth 1-2 times a week  #1 bottle x 6   Entered and Authorized by:   Lemont Fillers FNP   Signed by:   Lemont Fillers FNP on 05/11/2010   Method used:   Print then Give to Patient   RxID:   [id number]    Orders Added: 1)  TLB-BMP (Basic Metabolic Panel-BMET) [80048-METABOL] 2)  TLB-CBC Platelet - w/Differential [85025-CBCD] 3)  TLB-Hepatic/Liver Function Pnl [80076-HEPATIC] 4)  TLB-TSH (Thyroid Stimulating Hormone) [84443-TSH] 5)  T-Hgb A1C [83036-23375] 6)  T-Microalbumin Urine [facility]) [82043-MALBCR] 7)  Pneumococcal Vaccine [90732] 8)  Admin 1st Vaccine [90471] 9)  Est. Patient 40-64 years [99396] 10)  Est. Patient Level III [42353]   Immunization History:  Influenza Immunization History:    Influenza:  historical (03/08/2010)  Immunizations Administered:  Pneumonia Vaccine:    Vaccine Type: Pneumovax    Site: right deltoid    Mfr: Merck    Dose: 0.5 ml  Route: IM    Given by: Mervin Kung CMA (AAMA)    Exp. Date: 09/02/2011    Lot #: [id number]    VIS given: 04/12/09 version given May 11, 2010.   Immunization History:  Influenza Immunization History:    Influenza:  Historical (03/08/2010)  Immunizations Administered:  Pneumonia Vaccine:    Vaccine Type: Pneumovax    Site: right deltoid    Mfr: Merck    Dose: 0.5 ml    Route: IM    Given by: Mervin Kung CMA (AAMA)    Exp. Date: 09/02/2011    Lot #: [id number]    VIS given: 04/12/09 version given May 11, 2010.  Current Allergies (reviewed today): No known allergies

## 2010-06-09 NOTE — Progress Notes (Addendum)
Summary: additional questions / concerns  Phone Note Call from Patient Call back at Work Phone [phone]   Caller: Patient Call For: Lemont Fillers FNP Summary of Call: Pt called stating that the generic Prozac does not work as well for her.  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  Wants me to send rx to [facility] for BMN on Prozac. Corrected Rx has been faxed.  Pt states she needs greater quantity of needles than 24. States she had received a pack of 100 previously and is requesting this be called to [place] [employer]. Spoke to pharmacy and verified that each box of pen needles has 6 needles per box.  Also, pt wants to know if there is a generic of Crestor that she can get as it would be cheaper?   Initial call taken by: Mervin Kung CMA Duncan Dull),  May 17, 2010 10:44 AM  Follow-up for Phone Call        Unfortunately, there is no generic of crestor.  The generic medication that she was on was before was not strong enough for her.  I would prefer that she take the crestor if she can.  OK to sent additional needles. Follow-up by: Lemont Fillers FNP,  May 17, 2010 4:05 PM  Additional Follow-up for Phone Call Additional follow up Details #1::        Pen needles with adjusted quantity sent to [facility]. Pt notified per Mehmet Scally's direction and voices understanding. Nicki Guadalajara Fergerson CMA (AAMA)  May 18, 2010 8:30 AM     New/Updated Medications: BD PEN NEEDLE SHORT U/F 31G X 8 MM MISC (INSULIN PEN NEEDLE) use as directed Prescriptions: BD PEN NEEDLE SHORT U/F 31G X 8 MM MISC (INSULIN PEN NEEDLE) use as directed  #100 x 1   Entered by:   Mervin Kung CMA (AAMA)   Authorized by:   Lemont Fillers FNP   Signed by:   Mervin Kung CMA (AAMA) on 05/18/2010   Method used:   Electronically to        [facility] [phone]* (retail)       [address] [place], [place]  [address]       Ph: [phone]       Fax: [phone]   RxID:    [id number] PROZAC 20 MG CAPS (FLUOXETINE HCL) Take one tablet by mouth oncea  day Brand medically necessary #90 x 1   Entered by:   Mervin Kung CMA (AAMA)   Authorized by:   Lemont Fillers FNP   Signed by:   Mervin Kung CMA (AAMA) on 05/17/2010   Method used:   Faxed to ...       [facility] (mail-order)             , [place]         Ph: [phone]       Fax: [phone]   RxID:   [id number]   Appended Document: additional questions / concerns Received Fax from [facility] to consider using Generic Prozac. 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 He verifed receipt and will make note in pt's file.

## 2010-08-15 ENCOUNTER — Telehealth: Payer: Self-pay | Admitting: Family

## 2010-08-15 ENCOUNTER — Other Ambulatory Visit: Payer: Self-pay | Admitting: Obstetrics and Gynecology

## 2010-08-15 DIAGNOSIS — Z1231 Encounter for screening mammogram for malignant neoplasm of breast: Secondary | ICD-10-CM

## 2010-08-15 NOTE — Telephone Encounter (Signed)
Refill-crestor 10mg  tab. Take one tablet by mouth every day. Qty 30. Last fill 3.5.12

## 2010-08-16 ENCOUNTER — Ambulatory Visit: Payer: Self-pay | Admitting: Family

## 2010-08-16 MED ORDER — ROSUVASTATIN CALCIUM 10 MG PO TABS: 10.0000 mg | ORAL_TABLET | Freq: Every day | ORAL | Status: DC

## 2010-08-16 NOTE — Telephone Encounter (Signed)
Rx was faxed to pharmacy. Pt notified and f/u was rescheduled for 08/22/09 @ 11am.

## 2010-08-16 NOTE — Telephone Encounter (Signed)
Med refilled. Pt needs to reschedule follow up. Left message on machine for pt to return my call.

## 2010-08-18 LAB — BASIC METABOLIC PANEL
BUN: 7 mg/dL (ref 6–23)
CO2: 27 mEq/L (ref 19–32)
Calcium: 8.8 mg/dL (ref 8.4–10.5)
Chloride: 100 mEq/L (ref 96–112)
GFR calc Af Amer: >60 mL/min (ref >60)
Glucose, Bld: 241 mg/dL — ABNORMAL HIGH (ref 70–99)
Potassium: 3.4 mEq/L — ABNORMAL LOW (ref 3.5–5.1)

## 2010-08-18 LAB — CBC
HCT: 39 % (ref 36.0–46.0)
Hemoglobin: 13.3 g/dL (ref 12.0–15.0)
MCHC: 34.2 g/dL (ref 30.0–36.0)
MCV: 87.7 fL (ref 78.0–100.0)
RBC: 4.45 MIL/uL (ref 3.87–5.11)

## 2010-08-18 LAB — GLUCOSE, CAPILLARY
Glucose-Capillary: 177 mg/dL — ABNORMAL HIGH (ref 70–99)
Glucose-Capillary: 186 mg/dL — ABNORMAL HIGH (ref 70–99)

## 2010-08-23 ENCOUNTER — Ambulatory Visit
Admission: RE | Admit: 2010-08-23 | Discharge: 2010-08-23 | Disposition: A | Discharge status: Home / Self Care | Payer: 59 | Source: Ambulatory Visit | Attending: Obstetrics and Gynecology | Admitting: Obstetrics and Gynecology

## 2010-08-23 ENCOUNTER — Ambulatory Visit: Payer: Self-pay | Admitting: Family

## 2010-08-23 DIAGNOSIS — Z1231 Encounter for screening mammogram for malignant neoplasm of breast: Secondary | ICD-10-CM

## 2010-09-20 ENCOUNTER — Other Ambulatory Visit: Payer: Self-pay | Admitting: Family

## 2010-09-20 DIAGNOSIS — E785 Hyperlipidemia, unspecified: Secondary | ICD-10-CM

## 2010-09-20 NOTE — Op Note (Signed)
NAMECYDNIE, DEASON                 ACCOUNT NO.:  [id number]   MEDICAL RECORD NO.:  [id number]          PATIENT TYPE:  AMB   LOCATION:  [facility]                           FACILITY:  [facility]   PHYSICIAN:  Cynthia P. Romine, M.D.DATE OF BIRTH:  1961-03-06   DATE OF PROCEDURE:  07/20/2008  DATE OF DISCHARGE:                               OPERATIVE REPORT   PREOPERATIVE DIAGNOSIS:  Imbedded ParaGard intrauterine contraceptive  device.   POSTOPERATIVE DIAGNOSIS:  Imbedded ParaGard intrauterine contraceptive  device.   PROCEDURE:  Removal of imbedded ParaGard intrauterine contraceptive  device.   SURGEON:  Cynthia P. Romine, MD   ANESTHESIA:  General by LMA.   ESTIMATED BLOOD LOSS:  Minimal.   COMPLICATIONS:  None.   PROCEDURE:  The patient was taken to the operating room and after  induction of adequate general anesthesia was placed in a dorsal  lithotomy position and prepped and draped in the usual fashion.  The  bladder was drained with a red rubber catheter.  The posterior weighted  and anterior Sims retractor were placed.  The cervix was grasped on its  anterior lip with a single-tooth tenaculum.  Paracervical block was  instituted by injecting 10 mL of 1% plain Xylocaine at each of 3 and 9  o'clock.  The uterus sounded to 8 cm.  The cervix was dilated to a #31  Pratt.  The hysteroscope was introduced and the sorbitol was used as a  distention medium and a pressure setting of 80.  The tail of the IUD  could be seen in the cavity and the wings of the IUD apparently were  superficially imbedded because when a grasper was used through the  hysteroscope to grasp the tail of the IUD it came out easily and intact.  The patient had requested to take the IUD home.  Her statement was that  it was a very extensive IUD between what she paid for it to put it in  and what she had to pay for it to get taken out and she wanted to see  it, so it was put in the specimen cup and kept for the patient.   The  hysteroscope was reintroduced into the cavity which otherwise appeared  normal and clear.  The hysteroscope was removed.  The instruments were  removed from the vagina.  There was a tear on the anterior lip of the  cervix from where the tenaculum had been placed that was closed with 2  figure-of-eight sutures of the 0 Vicryl.  Good hemostasis was then  achieved.  Instruments removed and the procedure was terminated.  The  patient tolerated it well and went in satisfactory condition to  Postanesthesia Recovery.      Cynthia P. Romine, M.D.  Electronically Signed     CPR/MEDQ  D:  07/20/2008  T:  07/21/2008  Job:  [id number]

## 2010-09-21 LAB — HEPATIC FUNCTION PANEL
ALT: 66 U/L — ABNORMAL HIGH (ref 0–35)
AST: 65 U/L — ABNORMAL HIGH (ref 0–37)
Bilirubin, Direct: 0.1 mg/dL (ref 0.0–0.3)
Indirect Bilirubin: 0.5 mg/dL (ref 0.0–0.9)
Total Bilirubin: 0.6 mg/dL (ref 0.3–1.2)

## 2010-09-21 LAB — LIPID PANEL: Cholesterol: 136 mg/dL (ref 0–200)

## 2010-09-23 ENCOUNTER — Ambulatory Visit (INDEPENDENT_AMBULATORY_CARE_PROVIDER_SITE_OTHER): Payer: 59 | Admitting: Family

## 2010-09-23 ENCOUNTER — Other Ambulatory Visit: Payer: Self-pay | Admitting: Family

## 2010-09-23 ENCOUNTER — Encounter: Payer: Self-pay | Admitting: Family

## 2010-09-23 VITALS — BP 132/84 | HR 66 | Temp 98.1°F | Resp 18 | Ht 63.11 in | Wt 211.0 lb

## 2010-09-23 DIAGNOSIS — E039 Hypothyroidism, unspecified: Secondary | ICD-10-CM

## 2010-09-23 DIAGNOSIS — IMO0001 Reserved for inherently not codable concepts without codable children: Secondary | ICD-10-CM

## 2010-09-23 DIAGNOSIS — E119 Type 2 diabetes mellitus without complications: Secondary | ICD-10-CM

## 2010-09-23 DIAGNOSIS — F419 Anxiety disorder, unspecified: Secondary | ICD-10-CM | POA: Insufficient documentation

## 2010-09-23 DIAGNOSIS — E785 Hyperlipidemia, unspecified: Secondary | ICD-10-CM

## 2010-09-23 DIAGNOSIS — R7401 Elevation of levels of liver transaminase levels: Secondary | ICD-10-CM

## 2010-09-23 DIAGNOSIS — F32A Depression, unspecified: Secondary | ICD-10-CM | POA: Insufficient documentation

## 2010-09-23 DIAGNOSIS — F341 Dysthymic disorder: Secondary | ICD-10-CM

## 2010-09-23 DIAGNOSIS — I10 Essential (primary) hypertension: Secondary | ICD-10-CM

## 2010-09-23 DIAGNOSIS — F329 Major depressive disorder, single episode, unspecified: Secondary | ICD-10-CM

## 2010-09-23 MED ORDER — ROSUVASTATIN CALCIUM 20 MG PO TABS: ORAL_TABLET | ORAL | Status: DC

## 2010-09-23 NOTE — Assessment & Plan Note (Signed)
We discussed possibility of transitioning her from fluoxetine to Cymbalta, to see if this would help with her symptoms. She notes that she has been taking the fluoxetine inconsistently, and wishes to try a daily regimen prior to initiating any changes. She denies suicidal ideation.

## 2010-09-23 NOTE — Assessment & Plan Note (Signed)
Lipid profile significantly improved now that she has started Crestor, LFTs are remaining stable. We'll continue statin with careful monitoring of LFTs.

## 2010-09-23 NOTE — Progress Notes (Signed)
Orders entered for future labs in August (bmp,tsh,hgb a1c) and forwarded to the lab.

## 2010-09-23 NOTE — Progress Notes (Signed)
Subjective:    Patient ID: Colleen Baldwin, female    DOB: [birth year], 50 y.o.   MRN: [id number]  HPI  DM2- fasting sugars about 200.  Metformin 1000mg  PO BID, Victoza.  Not urinating as frequenly as before.    Depression- feels like she has to "pump herself up".  More gloomy than before.  Occasionally, overwhelmed. Takes tylenol PM PRN. This generally helps with her sleep.   Review of Systems    see history of present illness  Past Medical History  Diagnosis Date  . Hypertension   . Diabetes mellitus     Type II  uncontrolled  . Hypothyroidism     s/p thyroidectomy for goiter  . Hyperlipidemia     History   Social History  . Marital Status: Married    Spouse Name: N/A    Number of Children: 0  . Years of Education: N/A   Occupational History  . INTERIOR DESIGNER    Social History Main Topics  . Smoking status: Never Smoker   . Smokeless tobacco: Not on file  . Alcohol Use: No  . Drug Use: No  . Sexually Active:    Other Topics Concern  . Not on file   Social History Narrative  . No narrative on file    Past Surgical History  Procedure Date  . Cholecystectomy 05-2004  . Thyroidectomy 09/1994  . Iud removal 2010    Family History  Problem Relation Age of Onset  . Cancer Mother     ?uterine  . Diabetes Father   . Thyroid disease Father   . Hypertension Sister   . Diabetes Maternal Grandmother     No Known Allergies  Current Outpatient Prescriptions on File Prior to Visit  Medication Sig Dispense Refill  . aspirin 81 MG tablet Take 81 mg by mouth daily.        . calcium carbonate (OS-CAL) 600 MG TABS Take 600 mg by mouth 2 (two) times daily with meals.        . cetirizine (ZYRTEC) 10 MG tablet Take 1 tablet by mouth 1-2 times a week.       . Diphenhydramine-APAP, sleep, (EXCEDRIN PM) 38-500 MG TABS Take 1 tablet by mouth at bedtime as needed.        Marland Kitchen FLUoxetine (PROZAC) 20 MG capsule Take 20 mg by mouth daily.       Marland Kitchen ibuprofen (ADVIL,MOTRIN) 200  MG tablet Take 2-3 tablets by mouth up to 3 times a day as needed for pain.       . Insulin Pen Needle (B-D ULTRAFINE III SHORT PEN) 31G X 8 MM MISC As directed.       Marland Kitchen levothyroxine (SYNTHROID, LEVOTHROID) 200 MCG tablet Take 200 mcg by mouth daily.        . Liraglutide (VICTOZA) 18 MG/3ML SOLN Inject 1.8 mg into the skin daily.        Marland Kitchen lisinopril (PRINIVIL,ZESTRIL) 5 MG tablet Take 5 mg by mouth daily.        . metFORMIN (GLUCOPHAGE) 1000 MG tablet Take 1,000 mg by mouth 2 (two) times daily with meals.        . Potassium Gluconate 595 MG TABS Take 2 tablets by mouth daily.       Marland Kitchen DISCONTD: rosuvastatin (CRESTOR) 10 MG tablet Take 1 tablet (10 mg total) by mouth daily.  30 tablet  0  . DISCONTD: acetaminophen (TYLENOL) 500 MG tablet Take 1-2 tablets by mouth every 4-6 hours as  needed.         BP 132/84  Pulse 66  Temp(Src) 98.1 F (36.7 C) (Oral)  Resp 18  Ht 5' 3.11" (1.603 m)  Wt 211 lb (95.709 kg)  BMI 37.25 kg/m2    Objective:   Physical Exam    general: Overweight, white female, awake and alert and in no acute distress Cardiovascular: S1, S2, regular rate and rhythm Respiratory: Breath sounds clear to auscultation bilaterally without wheezing rales or rhonchi Extremities: Diabetic foot exam Psychiatric: Awake, alert, appropriate affect and in no acute distress.    Assessment & Plan:  25 minutes spent with the patient today. Greater than 50% of this time was spent counseling the patient on diabetes, exercise, diet, weight loss, depression, and anxiety.

## 2010-09-23 NOTE — Assessment & Plan Note (Signed)
We will check A1c today. I suspect that her A1c will be somewhere between 8 and 9 based on her reported blood glucose readings. I have advised the patient that if we do not see a significant improvement in her numbers, then I would recommend initiation of insulin. She is resistant to this idea at this time. She is demanding a 3 month interval of dietary compliance prior to initiation of insulin. We discussed the long-term complications of uncontrolled diabetes at length.

## 2010-09-23 NOTE — Patient Instructions (Signed)
Please return the week of August 20th.  Go to the lab the week before for your blood work.

## 2010-09-23 NOTE — Assessment & Plan Note (Signed)
BP Readings from Last 3 Encounters:  09/23/10 132/84  05/11/10 120/90  12/06/09 100/78   blood pressure is stable. Continue current meds.

## 2010-09-23 NOTE — Assessment & Plan Note (Signed)
Essentially unchanged since the last visit. She did have a negative acute hepatitis panel performed last year. Fatty liver was confirmed on ultrasound. We discussed importance of diet exercise and weight loss.

## 2010-09-23 NOTE — Assessment & Plan Note (Signed)
TSH was stable last visit, we'll plan to repeat prior to next visit.

## 2010-09-24 LAB — HEMOGLOBIN A1C
Hgb A1c MFr Bld: 9.6 % — ABNORMAL HIGH (ref <5.7)
Mean Plasma Glucose: 229 mg/dL — ABNORMAL HIGH (ref <117)

## 2010-09-26 ENCOUNTER — Telehealth: Payer: Self-pay | Admitting: *Deleted

## 2010-09-26 ENCOUNTER — Telehealth: Payer: Self-pay | Admitting: Family

## 2010-09-26 DIAGNOSIS — E119 Type 2 diabetes mellitus without complications: Secondary | ICD-10-CM

## 2010-09-26 NOTE — Telephone Encounter (Signed)
Please call patient and let her know that her A1C is 9.6.  I would like for her to see an endocrinologist.  Myriam Jacobson will call her with appointment.

## 2010-09-26 NOTE — Telephone Encounter (Signed)
Orders entered and forwarded to the lab. 

## 2010-09-26 NOTE — Telephone Encounter (Signed)
Message copied by Mervin Kung on Mon Sep 26, 2010  5:00 PM ------      Message from: O'SULLIVAN, MELISSA      Created: Fri Sep 23, 2010 11:21 AM       Please fax orders to the lab for the following labs:            bmet (dm2)      tsh (hypothyroidism)      A1C (DM2)            She will return the second week in August.

## 2010-10-31 ENCOUNTER — Other Ambulatory Visit: Payer: Self-pay | Admitting: Family

## 2010-12-28 ENCOUNTER — Encounter: Payer: Self-pay | Admitting: Family

## 2010-12-28 ENCOUNTER — Ambulatory Visit (INDEPENDENT_AMBULATORY_CARE_PROVIDER_SITE_OTHER): Payer: 59 | Admitting: Family

## 2010-12-28 DIAGNOSIS — F419 Anxiety disorder, unspecified: Secondary | ICD-10-CM

## 2010-12-28 DIAGNOSIS — F32A Depression, unspecified: Secondary | ICD-10-CM

## 2010-12-28 DIAGNOSIS — F341 Dysthymic disorder: Secondary | ICD-10-CM

## 2010-12-28 DIAGNOSIS — E039 Hypothyroidism, unspecified: Secondary | ICD-10-CM

## 2010-12-28 DIAGNOSIS — IMO0001 Reserved for inherently not codable concepts without codable children: Secondary | ICD-10-CM

## 2010-12-28 DIAGNOSIS — F329 Major depressive disorder, single episode, unspecified: Secondary | ICD-10-CM

## 2010-12-28 DIAGNOSIS — E119 Type 2 diabetes mellitus without complications: Secondary | ICD-10-CM

## 2010-12-28 DIAGNOSIS — K7689 Other specified diseases of liver: Secondary | ICD-10-CM

## 2010-12-28 DIAGNOSIS — K760 Fatty (change of) liver, not elsewhere classified: Secondary | ICD-10-CM

## 2010-12-28 DIAGNOSIS — I10 Essential (primary) hypertension: Secondary | ICD-10-CM

## 2010-12-28 LAB — HEMOGLOBIN A1C: Hgb A1c MFr Bld: 9 % — ABNORMAL HIGH (ref <5.7)

## 2010-12-28 LAB — HEPATIC FUNCTION PANEL
AST: 51 U/L — ABNORMAL HIGH (ref 0–37)
Albumin: 4.7 g/dL (ref 3.5–5.2)
Alkaline Phosphatase: 93 U/L (ref 39–117)
Total Protein: 7.7 g/dL (ref 6.0–8.3)

## 2010-12-28 LAB — TSH: TSH: 0.76 u[IU]/mL (ref 0.350–4.500)

## 2010-12-28 MED ORDER — LIRAGLUTIDE 18 MG/3ML ~~LOC~~ SOLN: 1.2000 mg | Freq: Every day | SUBCUTANEOUS | Status: DC

## 2010-12-28 MED ORDER — METFORMIN HCL 1000 MG PO TABS: 1000.0000 mg | ORAL_TABLET | Freq: Two times a day (BID) | ORAL | Status: DC

## 2010-12-28 MED ORDER — INSULIN PEN NEEDLE 31G X 8 MM MISC: Status: DC

## 2010-12-28 MED ORDER — DULOXETINE HCL 60 MG PO CPEP: 60.0000 mg | ORAL_CAPSULE | Freq: Every day | ORAL | Status: DC

## 2010-12-28 MED ORDER — ROSUVASTATIN CALCIUM 10 MG PO TABS: 10.0000 mg | ORAL_TABLET | Freq: Every day | ORAL | Status: DC

## 2010-12-28 MED ORDER — LISINOPRIL 5 MG PO TABS: 5.0000 mg | ORAL_TABLET | Freq: Every day | ORAL | Status: DC

## 2010-12-28 NOTE — Assessment & Plan Note (Signed)
Check LFT's on statin.

## 2010-12-28 NOTE — Progress Notes (Signed)
Subjective:    Patient ID: Colleen Baldwin, female    DOB: Dec 31, 1960, 50 y.o.   MRN: [id number]  HPI  Colleen Baldwin is a 50 yr old female who presents today for follow up.    Yellow jacket sting (foot)- last night, slight discomfort at site without significant swelling.   DM2-  Using victoza and metformin. Only able to take the 1.2 dose of victoza, (takes at night)- unable to tolerate the 1.8 due to vomitting. Sugars- not checking every day- fasting today was 159. Feels like she has impoved her compliance to diet and meds since the end of June.  She did not keep her endocrinology apt.   Hyperlipidemia- Crestor. Tolerating without myalgias.  Hypothyroid- No complaints, due for f/u TSH.  Depression- She asked her husband of 15 yrs to move out of the home at the end of June.  She tells me that he has substance abuse problems and she was also having a lot of stress and "drama" with his children.  Since their separation she feels that she has been able to better focus on her own health.  Though from a depression standpoint she is not sure that prozac "is doing it."  Not feeling as "resiliant."  Occasional tearfulness.  Not sleeping well- wakes up in the middle of the night- stays awake for 2-3 hours.  Drinking too much tea in the afternoon she thinks- but doesn't wish to discontinue this.   HTN- Stable on ACE.  Denies chest pain or swelling.    Fatty liver/elevated LFT's- tells me that she very rarely drinks ETOH.            Review of Systems  Respiratory: Negative for shortness of breath.   Cardiovascular: Negative for chest pain.      see HPI  Past Medical History  Diagnosis Date  . Hypertension   . Diabetes mellitus     Type II  uncontrolled  . Hypothyroidism     s/p thyroidectomy for goiter  . Hyperlipidemia     History   Social History  . Marital Status: Married    Spouse Name: N/A    Number of Children: 0  . Years of Education: N/A   Occupational History  .  INTERIOR DESIGNER    Social History Main Topics  . Smoking status: Never Smoker   . Smokeless tobacco: Not on file  . Alcohol Use: No  . Drug Use: No  . Sexually Active:    Other Topics Concern  . Not on file   Social History Narrative  . No narrative on file    Past Surgical History  Procedure Date  . Cholecystectomy 05-2004  . Thyroidectomy 09/1994  . Iud removal 2010    Family History  Problem Relation Age of Onset  . Cancer Mother     ?uterine  . Diabetes Father   . Thyroid disease Father   . Hypertension Sister   . Diabetes Maternal Grandmother     Allergies  Allergen Reactions  . Shrimp (Shellfish Allergy) Swelling    Current Outpatient Prescriptions on File Prior to Visit  Medication Sig Dispense Refill  . aspirin 81 MG tablet Take 81 mg by mouth daily.        . calcium carbonate (OS-CAL) 600 MG TABS Take 600 mg by mouth 2 (two) times daily with meals.        . cetirizine (ZYRTEC) 10 MG tablet Take 1 tablet by mouth 1-2 times a week.       Marland Kitchen  Diphenhydramine-APAP, sleep, (EXCEDRIN PM) 38-500 MG TABS Take 1 tablet by mouth at bedtime as needed.        Marland Kitchen FLUoxetine (PROZAC) 20 MG capsule Take 20 mg by mouth daily.       Marland Kitchen ibuprofen (ADVIL,MOTRIN) 200 MG tablet Take 2-3 tablets by mouth up to 3 times a day as needed for pain.       Marland Kitchen levothyroxine (SYNTHROID, LEVOTHROID) 200 MCG tablet Take 200 mcg by mouth daily.        . medroxyPROGESTERone (PROVERA) 10 MG tablet Take 10 mg by mouth daily. Take 1 tablet daily for 10 days. Takes every 3 months.      . Potassium Gluconate 595 MG TABS Take 2 tablets by mouth daily.         BP 120/84  Pulse 78  Temp(Src) 98.1 F (36.7 C) (Oral)  Resp 16  Ht 5' 3.11" (1.603 m)  Wt 203 lb 1.3 oz (92.116 kg)  BMI 35.85 kg/m2    Objective:   Physical Exam  Constitutional: She appears well-developed and well-nourished.  HENT:  Head: Normocephalic and atraumatic.  Eyes: Conjunctivae are normal. Pupils are equal, round, and  reactive to light.  Cardiovascular: Normal rate and regular rhythm.   Pulmonary/Chest: Effort normal and breath sounds normal.  Musculoskeletal: She exhibits no edema.  Psychiatric: Her behavior is normal. Judgment and thought content normal.       Affect is mildly flat.           Assessment & Plan:

## 2010-12-28 NOTE — Assessment & Plan Note (Signed)
Depression is deteriorated.  Will switch from Prozac to Cymbalta.  Samples were provided today for 30mg  once daily for 1 week (#7) then increase to 60 mg once daily (#21) follow up in 1 month.

## 2010-12-28 NOTE — Assessment & Plan Note (Signed)
BP is stable on ACE, continue same, check BMET.

## 2010-12-28 NOTE — Assessment & Plan Note (Signed)
I suspect that her A1C is still going to be far from goal.  I have advised her to reschedule her apt with Endocrinology.

## 2010-12-28 NOTE — Patient Instructions (Signed)
Please complete your lab work on the first floor.  Follow up in 3 months. Please reschedule your appointment at [facility].

## 2010-12-28 NOTE — Assessment & Plan Note (Signed)
Check TSH 

## 2010-12-29 LAB — BASIC METABOLIC PANEL WITH GFR
BUN: 11 mg/dL (ref 6–23)
CO2: 24 mEq/L (ref 19–32)
Calcium: 9.5 mg/dL (ref 8.4–10.5)
Chloride: 99 mEq/L (ref 96–112)
Creat: 0.6 mg/dL (ref 0.50–1.10)

## 2010-12-30 ENCOUNTER — Telehealth: Payer: Self-pay | Admitting: Family

## 2010-12-30 MED ORDER — LEVOTHYROXINE SODIUM 100 MCG PO TABS: 100.0000 ug | ORAL_TABLET | Freq: Every day | ORAL | Status: DC

## 2010-12-30 MED ORDER — LEVOTHYROXINE SODIUM 88 MCG PO TABS: 88.0000 ug | ORAL_TABLET | Freq: Every day | ORAL | Status: DC

## 2010-12-30 NOTE — Telephone Encounter (Signed)
Left message on machine to return my call. 

## 2010-12-30 NOTE — Telephone Encounter (Signed)
Pls let pt know that diabetes is still poorly controlled.  A1C today is 9.0 (was 9.6 in May) goal is <7.  I would like for her to reschedule apt with endocrinology. Also, I would like to cut her thyroid med down from to 188 mcg.  I will need for her to take a + an tab once daily in order to achieve that dose.  Rx's were sent to her pharmacy. Follow up in September as scheduled.

## 2011-01-02 NOTE — Telephone Encounter (Signed)
Pt notified and voices understanding. 

## 2011-01-02 NOTE — Telephone Encounter (Signed)
Left message on machine to return my call. 

## 2011-01-31 ENCOUNTER — Ambulatory Visit: Payer: 59 | Admitting: Family

## 2011-04-14 ENCOUNTER — Ambulatory Visit (INDEPENDENT_AMBULATORY_CARE_PROVIDER_SITE_OTHER): Payer: 59 | Admitting: Internal Medicine

## 2011-04-14 ENCOUNTER — Encounter: Payer: Self-pay | Admitting: Internal Medicine

## 2011-04-14 VITALS — BP 142/80 | HR 86 | Temp 98.1°F | Resp 18 | Wt 207.0 lb

## 2011-04-14 DIAGNOSIS — E039 Hypothyroidism, unspecified: Secondary | ICD-10-CM

## 2011-04-14 DIAGNOSIS — IMO0001 Reserved for inherently not codable concepts without codable children: Secondary | ICD-10-CM

## 2011-04-14 DIAGNOSIS — E785 Hyperlipidemia, unspecified: Secondary | ICD-10-CM

## 2011-04-14 DIAGNOSIS — H6120 Impacted cerumen, unspecified ear: Secondary | ICD-10-CM

## 2011-04-14 MED ORDER — DULOXETINE HCL 60 MG PO CPEP: 60.0000 mg | ORAL_CAPSULE | Freq: Every day | ORAL | Status: DC

## 2011-04-14 MED ORDER — ROSUVASTATIN CALCIUM 10 MG PO TABS: 10.0000 mg | ORAL_TABLET | Freq: Every day | ORAL | Status: DC

## 2011-04-14 NOTE — Progress Notes (Signed)
  Subjective:    Patient ID: LAISA LARRICK, female    DOB: 19-Jul-1960, 50 y.o.   MRN: [id number]  HPI Pt presents to clinic for followup of multiple medical problems. Not checking fsbs-lasting checks were around last visit. Has increased stressors in life. Reviewed last a1c 9.0. Has been off victoza due to side effects. Tolerates statin therapy without myalgias and mildly elevated lft's felt to be related to fatty liver. Right ear feels blocked. No other complaints.  Past Medical History  Diagnosis Date  . Hypertension   . Diabetes mellitus     Type II  uncontrolled  . Hypothyroidism     s/p thyroidectomy for goiter  . Hyperlipidemia    Past Surgical History  Procedure Date  . Cholecystectomy 05-2004  . Thyroidectomy 09/1994  . Iud removal 2010    reports that she has never smoked. She does not have any smokeless tobacco history on file. She reports that she does not drink alcohol or use illicit drugs. family history includes Cancer in her mother; Diabetes in her father and maternal grandmother; Hypertension in her sister; and Thyroid disease in her father. Allergies  Allergen Reactions  . Shrimp (Shellfish Allergy) Swelling     Review of Systems see hpi     Objective:   Physical Exam  Physical Exam  Nursing note and vitals reviewed. Constitutional: Appears well-developed and well-nourished. No distress.  HENT:  Head: Normocephalic and atraumatic.  Right Ear: +cerumen impaction. Left Ear: External ear normal.  Eyes: Conjunctivae are normal. No scleral icterus.  Neck: Neck supple. Carotid bruit is not present.  Cardiovascular: Normal rate, regular rhythm and normal heart sounds.  Exam reveals no gallop and no friction rub.   No murmur heard. Pulmonary/Chest: Effort normal and breath sounds normal. No respiratory distress. He has no wheezes. no rales.  Lymphadenopathy:    He has no cervical adenopathy.  Neurological:Alert.  Skin: Skin is warm and dry. Not diaphoretic.    Psychiatric: Has a normal mood and affect.        Assessment & Plan:

## 2011-04-14 NOTE — Patient Instructions (Signed)
Please schedule cbc, chem7, a1c, urine microalbumin 250.0, lipid/lft 272.4, tsh/free t4 (hypothyroidism) for next Tues fasting

## 2011-04-15 ENCOUNTER — Encounter: Payer: Self-pay | Admitting: Internal Medicine

## 2011-04-15 DIAGNOSIS — H6120 Impacted cerumen, unspecified ear: Secondary | ICD-10-CM | POA: Insufficient documentation

## 2011-04-15 NOTE — Assessment & Plan Note (Signed)
Obtain lipid/lft. 

## 2011-04-15 NOTE — Assessment & Plan Note (Signed)
Obtain tsh/free t4 

## 2011-04-15 NOTE — Assessment & Plan Note (Signed)
Reviewed potential for diabetic complications with suboptimal control. Resume fsbs bid. Prescription for test strips and lancets. Obtain cbc, chem7, a1c.

## 2011-04-15 NOTE — Assessment & Plan Note (Signed)
Irrigation performed.

## 2011-04-18 ENCOUNTER — Other Ambulatory Visit: Payer: Self-pay | Admitting: Family

## 2011-04-19 LAB — BASIC METABOLIC PANEL WITH GFR
BUN: 11 mg/dL (ref 6–23)
Chloride: 96 mEq/L (ref 96–112)
Creat: 0.48 mg/dL — ABNORMAL LOW (ref 0.50–1.10)
GFR, Est African American: >89 mL/min
GFR, Est Non African American: >89 mL/min

## 2011-04-19 LAB — TSH: TSH: 1.496 u[IU]/mL (ref 0.350–4.500)

## 2011-04-27 ENCOUNTER — Other Ambulatory Visit: Payer: Self-pay | Admitting: *Deleted

## 2011-04-27 MED ORDER — LEVOTHYROXINE SODIUM 100 MCG PO TABS: 100.0000 ug | ORAL_TABLET | Freq: Every day | ORAL | Status: DC

## 2011-04-27 MED ORDER — LEVOTHYROXINE SODIUM 88 MCG PO TABS: 88.0000 ug | ORAL_TABLET | Freq: Every day | ORAL | Status: DC

## 2011-04-27 NOTE — Telephone Encounter (Signed)
Patient called and left voice message requesting a refill on Levothyroxine to [facility] [address] in [place].  Call was returned to patient at [phone], to verify dose on Synthroid. She has verified that she is taking both 100 and 88 mcg. She is scheduled for follow up in January. Patient informed Rx would be sent to pharmacy.  Rx for synthroid sent to pharmacy.

## 2011-05-26 ENCOUNTER — Ambulatory Visit: Payer: 59 | Admitting: Internal Medicine

## 2011-05-29 ENCOUNTER — Other Ambulatory Visit: Payer: Self-pay | Admitting: Family

## 2011-07-18 ENCOUNTER — Encounter: Payer: Self-pay | Admitting: Internal Medicine

## 2011-07-18 ENCOUNTER — Ambulatory Visit (INDEPENDENT_AMBULATORY_CARE_PROVIDER_SITE_OTHER): Payer: 59 | Admitting: Internal Medicine

## 2011-07-18 VITALS — BP 128/80 | HR 105 | Temp 98.2°F | Resp 18 | Wt 200.0 lb

## 2011-07-18 DIAGNOSIS — IMO0001 Reserved for inherently not codable concepts without codable children: Secondary | ICD-10-CM

## 2011-07-18 DIAGNOSIS — E785 Hyperlipidemia, unspecified: Secondary | ICD-10-CM

## 2011-07-18 MED ORDER — LIRAGLUTIDE 18 MG/3ML ~~LOC~~ SOLN: SUBCUTANEOUS | Status: DC

## 2011-07-18 MED ORDER — INSULIN PEN NEEDLE 31G X 8 MM MISC: Status: DC

## 2011-07-18 MED ORDER — METFORMIN HCL 1000 MG PO TABS: 1000.0000 mg | ORAL_TABLET | Freq: Two times a day (BID) | ORAL | Status: DC

## 2011-07-18 MED ORDER — LEVOTHYROXINE SODIUM 88 MCG PO TABS: 88.0000 ug | ORAL_TABLET | Freq: Every day | ORAL | Status: DC

## 2011-07-18 MED ORDER — LEVOTHYROXINE SODIUM 100 MCG PO TABS: 100.0000 ug | ORAL_TABLET | Freq: Every day | ORAL | Status: DC

## 2011-07-18 NOTE — Progress Notes (Signed)
  Subjective:    Patient ID: Colleen Baldwin, female    DOB: [birth year], 51 y.o.   MRN: [id number]  HPI Pt presents to clinic for followup of multiple medical problems. Note checking fsbs. Last a1c 9.2 and consistently in the 9 range. Ready to resume victoza. Diabetic eye exam utd 6/12. Total time of visit ~26 minutes of which >50% of time spent in counseling.   Past Medical History  Diagnosis Date  . Hypertension   . Diabetes mellitus     Type II  uncontrolled  . Hypothyroidism     s/p thyroidectomy for goiter  . Hyperlipidemia    Past Surgical History  Procedure Date  . Cholecystectomy 05-2004  . Thyroidectomy 09/1994  . Iud removal 2010    reports that she has never smoked. She has never used smokeless tobacco. She reports that she does not drink alcohol or use illicit drugs. family history includes Cancer in her mother; Diabetes in her father and maternal grandmother; Hypertension in her sister; and Thyroid disease in her father. Allergies  Allergen Reactions  . Shrimp (Shellfish Allergy) Swelling     Review of Systems see hpi     Objective:   Physical Exam  Physical Exam  Nursing note and vitals reviewed. Constitutional: Appears well-developed and well-nourished. No distress.  HENT:  Head: Normocephalic and atraumatic.  Right Ear: External ear normal.  Left Ear: External ear normal.  Eyes: Conjunctivae are normal. No scleral icterus.  Neck: Neck supple. Carotid bruit is not present.  Cardiovascular: Normal rate, regular rhythm and normal heart sounds.  Exam reveals no gallop and no friction rub.   No murmur heard. Pulmonary/Chest: Effort normal and breath sounds normal. No respiratory distress. He has no wheezes. no rales.  Lymphadenopathy:    He has no cervical adenopathy.  Neurological:Alert.  Skin: Skin is warm and dry. Not diaphoretic.  Psychiatric: Has a normal mood and affect.   Diabetic foot exam: +2 DP pulses, no diabetic wounds, ulcerations or significant  callousing. Monofilament exam nl.      Assessment & Plan:

## 2011-07-18 NOTE — Patient Instructions (Signed)
Please schedule fasting labs for this week Cbc, chem7, a1c, urine microalbumin 250.0 and lipid/lft 272.4

## 2011-07-23 NOTE — Assessment & Plan Note (Signed)
Obtain lipid/lft. 

## 2011-07-23 NOTE — Assessment & Plan Note (Signed)
Obtain cbc, chem7, a1c, urine microalbumin. Resume victoza. Resume fsbs qd and call results in 2 weeks

## 2011-07-28 ENCOUNTER — Telehealth: Payer: Self-pay | Admitting: Internal Medicine

## 2011-07-28 DIAGNOSIS — IMO0001 Reserved for inherently not codable concepts without codable children: Secondary | ICD-10-CM

## 2011-07-28 DIAGNOSIS — E785 Hyperlipidemia, unspecified: Secondary | ICD-10-CM

## 2011-07-28 NOTE — Telephone Encounter (Signed)
Patient will be here Monday for labs @ 8am    ? order

## 2011-07-28 NOTE — Telephone Encounter (Signed)
Lab orders printed and sent to lab.

## 2011-08-01 LAB — CBC
MCH: 29.1 pg (ref 26.0–34.0)
MCV: 87.4 fL (ref 78.0–100.0)
Platelets: 333 10*3/uL (ref 150–400)
RBC: 4.7 MIL/uL (ref 3.87–5.11)

## 2011-08-01 LAB — LIPID PANEL
HDL: 39 mg/dL — ABNORMAL LOW (ref >39)
LDL Cholesterol: 71 mg/dL (ref 0–99)
Triglycerides: 190 mg/dL — ABNORMAL HIGH (ref <150)
VLDL: 38 mg/dL (ref 0–40)

## 2011-08-01 LAB — BASIC METABOLIC PANEL
CO2: 26 mEq/L (ref 19–32)
Calcium: 8.7 mg/dL (ref 8.4–10.5)
Creat: 0.55 mg/dL (ref 0.50–1.10)
Glucose, Bld: 230 mg/dL — ABNORMAL HIGH (ref 70–99)

## 2011-08-01 LAB — HEPATIC FUNCTION PANEL
Albumin: 4.1 g/dL (ref 3.5–5.2)
Indirect Bilirubin: 0.2 mg/dL (ref 0.0–0.9)
Total Bilirubin: 0.3 mg/dL (ref 0.3–1.2)
Total Protein: 6.7 g/dL (ref 6.0–8.3)

## 2011-08-01 LAB — MICROALBUMIN / CREATININE URINE RATIO: Microalb, Ur: 1.48 mg/dL (ref 0.00–1.89)

## 2011-08-24 ENCOUNTER — Ambulatory Visit: Payer: 59

## 2011-08-24 ENCOUNTER — Other Ambulatory Visit: Payer: Self-pay | Admitting: Obstetrics and Gynecology

## 2011-08-24 DIAGNOSIS — Z139 Encounter for screening, unspecified: Secondary | ICD-10-CM

## 2011-09-05 ENCOUNTER — Ambulatory Visit
Admission: RE | Admit: 2011-09-05 | Discharge: 2011-09-05 | Disposition: A | Discharge status: Home / Self Care | Payer: 59 | Source: Ambulatory Visit | Attending: Obstetrics and Gynecology | Admitting: Obstetrics and Gynecology

## 2011-09-05 DIAGNOSIS — Z139 Encounter for screening, unspecified: Secondary | ICD-10-CM

## 2011-09-13 ENCOUNTER — Other Ambulatory Visit: Payer: Self-pay | Admitting: Family

## 2011-09-13 NOTE — Telephone Encounter (Signed)
Done

## 2011-12-05 ENCOUNTER — Other Ambulatory Visit: Payer: Self-pay | Admitting: Internal Medicine

## 2011-12-05 NOTE — Telephone Encounter (Signed)
Attempted to reach pt to verify that she would like to continue refills through mail order. Left message for pt to return my call.

## 2011-12-06 MED ORDER — DULOXETINE HCL 60 MG PO CPEP: 60.0000 mg | ORAL_CAPSULE | Freq: Every day | ORAL | Status: DC

## 2011-12-06 NOTE — Telephone Encounter (Signed)
Pt returned my call and states she would like to continue using mail order for cost savings. Pt will f/u on 12/22/11 and wants to discuss possibility of changing Cymbalt back to Prozac. Pt agreed it would be ok to send 30 day supply of Cymbalta to [facility]. Crestor #[id number] x no refills sent to [facility].

## 2011-12-22 ENCOUNTER — Ambulatory Visit: Payer: 59 | Admitting: Internal Medicine

## 2011-12-25 ENCOUNTER — Encounter: Payer: Self-pay | Admitting: Internal Medicine

## 2011-12-28 ENCOUNTER — Ambulatory Visit (INDEPENDENT_AMBULATORY_CARE_PROVIDER_SITE_OTHER): Payer: 59 | Admitting: Internal Medicine

## 2011-12-28 ENCOUNTER — Encounter: Payer: Self-pay | Admitting: Internal Medicine

## 2011-12-28 VITALS — BP 136/80 | HR 94 | Temp 98.2°F | Resp 16 | Wt 202.0 lb

## 2011-12-28 DIAGNOSIS — F341 Dysthymic disorder: Secondary | ICD-10-CM

## 2011-12-28 DIAGNOSIS — M25539 Pain in unspecified wrist: Secondary | ICD-10-CM | POA: Insufficient documentation

## 2011-12-28 DIAGNOSIS — E039 Hypothyroidism, unspecified: Secondary | ICD-10-CM

## 2011-12-28 DIAGNOSIS — F329 Major depressive disorder, single episode, unspecified: Secondary | ICD-10-CM

## 2011-12-28 DIAGNOSIS — IMO0001 Reserved for inherently not codable concepts without codable children: Secondary | ICD-10-CM

## 2011-12-28 DIAGNOSIS — F32A Depression, unspecified: Secondary | ICD-10-CM

## 2011-12-28 DIAGNOSIS — E785 Hyperlipidemia, unspecified: Secondary | ICD-10-CM

## 2011-12-28 MED ORDER — DICLOFENAC SODIUM 75 MG PO TBEC: DELAYED_RELEASE_TABLET | ORAL | Status: AC

## 2011-12-28 MED ORDER — FLUOXETINE HCL 20 MG PO TABS: 20.0000 mg | ORAL_TABLET | Freq: Every day | ORAL | Status: DC

## 2011-12-28 NOTE — Assessment & Plan Note (Signed)
Obtain plain xray. Attempt voltaren with food and no other nsaids. Followup if no improvement or worsening.

## 2011-12-28 NOTE — Assessment & Plan Note (Signed)
Change cymbalta to prozac. Instructions and samples provided for transition. Followup if no improvement or worsening.

## 2011-12-28 NOTE — Assessment & Plan Note (Signed)
Obtain cbc, chem7, a1c. Resume fsbs checks daily. Weight loss recommended.

## 2011-12-28 NOTE — Patient Instructions (Signed)
1) please schedule fasting labs for Monday am Cbc, chem7, a1c-250.00, lipid/lft-272.4 and tsh/free t4-hypothyroidism 2) stop your cymbalta 60mg . Begin taking 30mg  once a day (samples) as well as prozac 20mg  a day. After 4 days change the cymbalta to every other day until gone.

## 2011-12-28 NOTE — Progress Notes (Signed)
  Subjective:    Patient ID: Colleen Baldwin, female    DOB: [birth year], 51 y.o.   MRN: [id number]  HPI Pt presents to clinic for followup of multiple medical problems. Notes 8 wk h/o left wrist/thumb base pain. May have struck the hand but unsure. Has intact ROM. Wishes to resume prozac in the place of cymbalta as it was more effective. Not checking fsbs. Has increased victoza to .12 and continues with bid metformin. Recently had colonoscopy 2013 and states f/u 3 years. Diabetic eye exam utd 8/13 without retinopathy.   Past Medical History  Diagnosis Date  . Hypertension   . Diabetes mellitus     Type II  uncontrolled  . Hypothyroidism     s/p thyroidectomy for goiter  . Hyperlipidemia    Past Surgical History  Procedure Date  . Cholecystectomy 05-2004  . Thyroidectomy 09/1994  . Iud removal 2010    reports that she has never smoked. She has never used smokeless tobacco. She reports that she does not drink alcohol or use illicit drugs. family history includes Cancer in her mother; Diabetes in her father and maternal grandmother; Hypertension in her sister; and Thyroid disease in her father. Allergies  Allergen Reactions  . Shrimp (Shellfish Allergy) Swelling      Review of Systems see hpi     Objective:   Physical Exam  Physical Exam  Nursing note and vitals reviewed. Constitutional: Appears well-developed and well-nourished. No distress.  HENT:  Head: Normocephalic and atraumatic.  Right Ear: External ear normal.  Left Ear: External ear normal.  Eyes: Conjunctivae are normal. No scleral icterus.  Neck: Neck supple. Carotid bruit is not present.  Cardiovascular: Normal rate, regular rhythm and normal heart sounds.  Exam reveals no gallop and no friction rub.   No murmur heard. Pulmonary/Chest: Effort normal and breath sounds normal. No respiratory distress. He has no wheezes. no rales.  Lymphadenopathy:    He has no cervical adenopathy.  Neurological:Alert.  Skin: Skin is  warm and dry. Not diaphoretic.  Psychiatric: Has a normal mood and affect.  MSK: left wrist- FROM. No erythema, warmth or effusion. Anatomical snuff box NT. No bony abn. Mild tenderness overlying thumb base tendons.      Assessment & Plan:

## 2011-12-28 NOTE — Assessment & Plan Note (Signed)
Obtain lipid/lft. 

## 2011-12-28 NOTE — Assessment & Plan Note (Signed)
Obtain tsh/ft4 

## 2012-01-02 ENCOUNTER — Telehealth: Payer: Self-pay | Admitting: *Deleted

## 2012-01-02 ENCOUNTER — Ambulatory Visit (HOSPITAL_BASED_OUTPATIENT_CLINIC_OR_DEPARTMENT_OTHER)
Admission: RE | Admit: 2012-01-02 | Discharge: 2012-01-02 | Disposition: A | Discharge status: Home / Self Care | Payer: 59 | Source: Ambulatory Visit | Attending: Internal Medicine | Admitting: Internal Medicine

## 2012-01-02 DIAGNOSIS — E785 Hyperlipidemia, unspecified: Secondary | ICD-10-CM

## 2012-01-02 DIAGNOSIS — E039 Hypothyroidism, unspecified: Secondary | ICD-10-CM

## 2012-01-02 DIAGNOSIS — IMO0001 Reserved for inherently not codable concepts without codable children: Secondary | ICD-10-CM

## 2012-01-02 DIAGNOSIS — M25539 Pain in unspecified wrist: Secondary | ICD-10-CM | POA: Insufficient documentation

## 2012-01-02 LAB — HEPATIC FUNCTION PANEL
ALT: 34 U/L (ref 0–35)
AST: 35 U/L (ref 0–37)
Bilirubin, Direct: 0.1 mg/dL (ref 0.0–0.3)
Total Protein: 6.6 g/dL (ref 6.0–8.3)

## 2012-01-02 LAB — CBC WITH DIFFERENTIAL/PLATELET
Eosinophils Absolute: 0.2 10*3/uL (ref 0.0–0.7)
Hemoglobin: 12.7 g/dL (ref 12.0–15.0)
Lymphocytes Relative: 35 % (ref 12–46)
Lymphs Abs: 2.6 10*3/uL (ref 0.7–4.0)
MCH: 27.3 pg (ref 26.0–34.0)
Monocytes Relative: 6 % (ref 3–12)
Neutro Abs: 4.3 10*3/uL (ref 1.7–7.7)
Neutrophils Relative %: 56 % (ref 43–77)
Platelets: 343 10*3/uL (ref 150–400)
RBC: 4.65 MIL/uL (ref 3.87–5.11)
WBC: 7.6 10*3/uL (ref 4.0–10.5)

## 2012-01-02 LAB — LIPID PANEL
Cholesterol: 130 mg/dL (ref 0–200)
Total CHOL/HDL Ratio: 3.6 Ratio
Triglycerides: 154 mg/dL — ABNORMAL HIGH (ref <150)
VLDL: 31 mg/dL (ref 0–40)

## 2012-01-02 LAB — TSH: TSH: 2.445 u[IU]/mL (ref 0.350–4.500)

## 2012-01-02 LAB — BASIC METABOLIC PANEL
Calcium: 8.5 mg/dL (ref 8.4–10.5)
Glucose, Bld: 231 mg/dL — ABNORMAL HIGH (ref 70–99)

## 2012-01-02 LAB — HEMOGLOBIN A1C
Hgb A1c MFr Bld: 11.5 % — ABNORMAL HIGH (ref <5.7)
Mean Plasma Glucose: 283 mg/dL — ABNORMAL HIGH (ref <117)

## 2012-01-02 NOTE — Telephone Encounter (Signed)
Received call from lab that pt had presented for blood work. Orders entered per 12/28/11 office note and given to the lab:  please schedule fasting labs for Monday am  Cbc, chem7, a1c-250.00, lipid/lft-272.4 and tsh/free t4-hypothyroidism

## 2012-01-09 ENCOUNTER — Telehealth: Payer: Self-pay | Admitting: *Deleted

## 2012-01-09 NOTE — Telephone Encounter (Signed)
Pt called stating that there is a problem with Insurance non-coverage on Rx change from 08.22.13 OV; [2] changes were made [Voltaren & Prozac], LMOM with contact name & number for pt return call to clarify which RX & what change needs to be made. Let pt know that [facility] is out of office until Thursday, 09.05.13, but if it is an issue that cannot be resolved w/o MD approval, I will let her know, as well/SLS

## 2012-01-09 NOTE — Telephone Encounter (Signed)
Pt requesting to resume Cymbalta 60 mg for Insurance coverage purposes; informed pt that [facility] will not be back in office until Thursday, 09.05.13 and would need PCP approval as this was removed from medication list at last OV. Gave pt Samples until approval to resume & new Rx sent to [facility] pharmacy/SLS

## 2012-01-16 ENCOUNTER — Telehealth: Payer: Self-pay | Admitting: Internal Medicine

## 2012-01-16 MED ORDER — LEVOTHYROXINE SODIUM 88 MCG PO TABS: 88.0000 ug | ORAL_TABLET | Freq: Every day | ORAL | Status: DC

## 2012-01-16 MED ORDER — LEVOTHYROXINE SODIUM 100 MCG PO TABS: 100.0000 ug | ORAL_TABLET | Freq: Every day | ORAL | Status: DC

## 2012-01-16 MED ORDER — METFORMIN HCL 1000 MG PO TABS: 1000.0000 mg | ORAL_TABLET | Freq: Two times a day (BID) | ORAL | Status: DC

## 2012-01-16 NOTE — Telephone Encounter (Signed)
Rx[s] done/SLS 

## 2012-01-16 NOTE — Telephone Encounter (Signed)
LEVOTHYROXIN QTY 90 LEVOTHYROXIN 100 MCG TAB QTY 90 METFORMIN 1000MG  TAB QTY 180

## 2012-01-25 ENCOUNTER — Telehealth: Payer: Self-pay | Admitting: *Deleted

## 2012-01-25 NOTE — Telephone Encounter (Signed)
Message copied by Regis Bill on Thu Jan 25, 2012  3:54 PM ------      Message from: Edwyna Perfect      Created: Thu Jan 11, 2012  7:25 PM       Diabetes under very poor control. 59month average ~283. Should have resumed fsbs checks daily and i need results. Very important. Thyroid and chol ok.

## 2012-01-25 NOTE — Telephone Encounter (Signed)
Message copied by Regis Bill on Thu Jan 25, 2012  3:55 PM ------      Message from: Edwyna Perfect      Created: Thu Jan 11, 2012  7:25 PM       Diabetes under very poor control. 41month average ~283. Should have resumed fsbs checks daily and i need results. Very important. Thyroid and chol ok.

## 2012-01-25 NOTE — Telephone Encounter (Signed)
Pt requesting to resume Cymbalta 60 mg for Insurance coverage purposes; informed pt that [facility] will not be back in office until Monday, 09.23.13, and would need PCP approval as this was removed from medication list at last OV. Gave pt Samples until approval to resume & new Rx sent to [facility] pharmacy/SLS Please advise.

## 2012-01-25 NOTE — Telephone Encounter (Signed)
Notes Recorded by Regis Bill, CMA on 01/17/2012 at 6:14 PM [facility] with contact name & number for return call RE: results & further MD instructions, informed fsbs numbers needed per [facility]/SLS Notes Recorded by Edwyna Perfect, MD on 01/11/2012 at 7:25 PM Diabetes under very poor control. 22month average ~283. Should have resumed fsbs checks daily and i need results. Very important. Thyroid and chol ok.  Patient informed & will keep fsbs log as requested in prior message/SLS

## 2012-01-26 NOTE — Telephone Encounter (Signed)
Ok to change back but she requested change from cymbalta to prozac (more effective.) prozac should be generics

## 2012-01-29 MED ORDER — DULOXETINE HCL 60 MG PO CPEP: 60.0000 mg | ORAL_CAPSULE | Freq: Every day | ORAL | Status: DC

## 2012-01-29 NOTE — Telephone Encounter (Signed)
Rx to mail order pharmacy; [facility] with contact name & number to inform pt/SLS

## 2012-02-02 MED ORDER — DULOXETINE HCL 60 MG PO CPEP: 60.0000 mg | ORAL_CAPSULE | Freq: Every day | ORAL | Status: DC

## 2012-02-02 NOTE — Addendum Note (Signed)
Addended by: Regis Bill on: 02/02/2012 03:07 PM   Modules accepted: Orders

## 2012-02-02 NOTE — Telephone Encounter (Signed)
Patient states that she needs the Cymbalta to go to [facility] [new mail order pharmacy]; done/SLS

## 2012-03-22 ENCOUNTER — Other Ambulatory Visit: Payer: Self-pay | Admitting: *Deleted

## 2012-03-22 MED ORDER — ROSUVASTATIN CALCIUM 10 MG PO TABS: ORAL_TABLET | ORAL | Status: DC

## 2012-03-22 NOTE — Progress Notes (Signed)
Per pt request to new mail order pharmacy/SLS

## 2012-03-26 ENCOUNTER — Ambulatory Visit: Payer: 59 | Admitting: Internal Medicine

## 2012-03-27 ENCOUNTER — Other Ambulatory Visit: Payer: Self-pay | Admitting: Family

## 2012-03-27 NOTE — Telephone Encounter (Signed)
Rx to pharmacy/SLS 

## 2012-07-15 ENCOUNTER — Other Ambulatory Visit: Payer: Self-pay | Admitting: Family

## 2012-07-15 ENCOUNTER — Other Ambulatory Visit: Payer: Self-pay | Admitting: Internal Medicine

## 2012-07-16 ENCOUNTER — Encounter: Payer: 59 | Admitting: Family

## 2012-07-22 ENCOUNTER — Other Ambulatory Visit: Payer: Self-pay | Admitting: *Deleted

## 2012-07-22 MED ORDER — LEVOTHYROXINE SODIUM 88 MCG PO TABS: 88.0000 ug | ORAL_TABLET | Freq: Every day | ORAL | Status: DC

## 2012-07-22 MED ORDER — LEVOTHYROXINE SODIUM 100 MCG PO TABS: 100.0000 ug | ORAL_TABLET | Freq: Every day | ORAL | Status: DC

## 2012-07-22 NOTE — Telephone Encounter (Signed)
Rx to pharmacy/SLS 

## 2012-07-29 ENCOUNTER — Telehealth: Payer: Self-pay

## 2012-07-29 NOTE — Telephone Encounter (Signed)
pts Levothyroxine manufacturer was changed and MD would like pt to get labs in 8 weeks. I tried to call pt but vm is full. I will mail a letter to patient

## 2012-07-30 ENCOUNTER — Telehealth: Payer: Self-pay | Admitting: *Deleted

## 2012-07-30 NOTE — Telephone Encounter (Signed)
Received message from [facility] [phone]) requesting clarification on recent levothyroxine rxs. Rxs sent for and . [facility] wants to know if pt should be taking both. REF # [id number]. Notified Maurine Minister that pt should take both.

## 2012-08-05 ENCOUNTER — Telehealth: Payer: Self-pay | Admitting: Internal Medicine

## 2012-08-05 MED ORDER — METFORMIN HCL 1000 MG PO TABS: 1000.0000 mg | ORAL_TABLET | Freq: Two times a day (BID) | ORAL | Status: DC

## 2012-08-05 NOTE — Telephone Encounter (Signed)
METFORMIN 1000MG  TAB QTY 180 TAKE 1 TABLET TWICE DAILY WITH MEAL

## 2012-08-13 ENCOUNTER — Other Ambulatory Visit: Payer: Self-pay | Admitting: Internal Medicine

## 2012-08-13 NOTE — Telephone Encounter (Signed)
Rx request to pharmacy  

## 2012-09-05 ENCOUNTER — Encounter: Payer: 59 | Admitting: Family Medicine

## 2012-10-11 ENCOUNTER — Other Ambulatory Visit: Payer: Self-pay | Admitting: Obstetrics and Gynecology

## 2012-10-11 DIAGNOSIS — Z1231 Encounter for screening mammogram for malignant neoplasm of breast: Secondary | ICD-10-CM

## 2012-10-15 ENCOUNTER — Inpatient Hospital Stay (HOSPITAL_BASED_OUTPATIENT_CLINIC_OR_DEPARTMENT_OTHER): Admission: RE | Admit: 2012-10-15 | Payer: 59 | Source: Ambulatory Visit

## 2012-10-15 ENCOUNTER — Ambulatory Visit (HOSPITAL_BASED_OUTPATIENT_CLINIC_OR_DEPARTMENT_OTHER): Payer: 59

## 2012-10-15 ENCOUNTER — Ambulatory Visit: Payer: 59

## 2012-10-16 ENCOUNTER — Ambulatory Visit (HOSPITAL_BASED_OUTPATIENT_CLINIC_OR_DEPARTMENT_OTHER)
Admission: RE | Admit: 2012-10-16 | Discharge: 2012-10-16 | Disposition: A | Discharge status: Home / Self Care | Payer: 59 | Source: Ambulatory Visit | Attending: Obstetrics and Gynecology | Admitting: Obstetrics and Gynecology

## 2012-10-16 DIAGNOSIS — Z1231 Encounter for screening mammogram for malignant neoplasm of breast: Secondary | ICD-10-CM | POA: Insufficient documentation

## 2012-10-23 ENCOUNTER — Other Ambulatory Visit: Payer: Self-pay | Admitting: Internal Medicine

## 2012-10-23 ENCOUNTER — Other Ambulatory Visit: Payer: Self-pay | Admitting: Family

## 2012-10-23 NOTE — Telephone Encounter (Signed)
Rx request to pharmacy; Office Visit Needed Prior to Future Refills, duplicate Rx entry deleted/SLS

## 2012-10-23 NOTE — Telephone Encounter (Signed)
The refill request for Crestor is denied because too soon for request; Last Rx 03.11.14 #[id number]/SLS

## 2012-10-31 ENCOUNTER — Encounter: Payer: Self-pay | Admitting: Obstetrics and Gynecology

## 2012-11-05 LAB — HM PAP SMEAR

## 2012-11-06 ENCOUNTER — Encounter: Payer: Self-pay | Admitting: Obstetrics and Gynecology

## 2012-11-06 ENCOUNTER — Ambulatory Visit (INDEPENDENT_AMBULATORY_CARE_PROVIDER_SITE_OTHER): Payer: 59 | Admitting: Obstetrics and Gynecology

## 2012-11-06 VITALS — BP 134/78 | HR 88 | Resp 16 | Ht 63.5 in | Wt 196.0 lb

## 2012-11-06 DIAGNOSIS — Z01419 Encounter for gynecological examination (general) (routine) without abnormal findings: Secondary | ICD-10-CM

## 2012-11-06 DIAGNOSIS — Z0000 Encounter for general adult medical examination without abnormal findings: Secondary | ICD-10-CM

## 2012-11-06 LAB — STD PANEL
HIV: NONREACTIVE
Hepatitis B Surface Ag: NEGATIVE

## 2012-11-06 NOTE — Progress Notes (Signed)
52 y.o.   Single    Caucasian   female   G1P0000   here for annual exam.  LMP Oct 2013.  Took provera last June and had no W/D and then nothing else until October 2013.  Having some hot flashes.  Not dating anyone currently.  Did have sex with a new partner several months ago and wants STD screen.  Patient's last menstrual period was 02/15/2012.          Sexually active: no  The current method of family planning is post menopausal status.    Exercising: not regularly Last mammogram:  10/11/2012 Last pap smear: 09/01/09 History of abnormal pap: 20+ years (ASCUS had cryosurgery done normal eversince) Smoking: no Alcohol: rarely Last colonoscopy: 7/13- had polyps repeat in 3 years.   Last Bone Density:  no Last tetanus shot: 2006 Last cholesterol check: has appt in 2 week 11/20/12  Hgb: PCP               Urine: PCP   Family History  Problem Relation Age of Onset  . Cancer Mother     ?uterine  . Diabetes Father   . Thyroid disease Father   . Hypertension Sister   . Diabetes Maternal Grandmother     Patient Active Problem List   Diagnosis Date Noted  . Wrist pain 12/28/2011  . Anxiety and depression 09/23/2010  . FATTY LIVER DISEASE 05/12/2010  . HYPERTENSION 12/06/2009  . TRANSAMINASES, SERUM, ELEVATED 05/13/2009  . UNSPECIFIED HYPOTHYROIDISM 05/11/2009  . DIABETES MELLITUS, UNCONTROLLED 05/11/2009  . HYPERLIPIDEMIA 05/11/2009    Past Medical History  Diagnosis Date  . Hypertension   . Diabetes mellitus     Type II  uncontrolled  . Hypothyroidism     s/p thyroidectomy for goiter  . Hyperlipidemia   . Depression   . Dysplasia of cervix, low grade (CIN 1)     Cryo   . Asthma     Past Surgical History  Procedure Laterality Date  . Cholecystectomy  05-2004  . Thyroidectomy  09/1994  . Iud removal  07/20/2008    Of embedded paraguard  . Tonsillectomy and adenoidectomy    . Laparoscopic cholecystectomy  2006    Allergies: Shrimp  Current Outpatient Prescriptions   Medication Sig Dispense Refill  . aspirin 81 MG tablet Take 81 mg by mouth daily.        . Aspirin-Acetaminophen-Caffeine (EXCEDRIN PO) Take by mouth as needed.      . calcium carbonate (OS-CAL) 600 MG TABS Take 600 mg by mouth 2 (two) times daily with meals.        . cetirizine (ZYRTEC) 10 MG tablet Take 1 tablet by mouth 1-2 times a week.       . CRESTOR 10 MG tablet Take 1 tablet by mouth at  bedtime  90 tablet  1  . CYMBALTA 60 MG capsule Take 1 capsule by mouth  daily  90 capsule  1  . ibuprofen (ADVIL,MOTRIN) 200 MG tablet Take 2-3 tablets by mouth up to 3 times a day as needed for pain.       . Insulin Pen Needle (B-D ULTRAFINE III SHORT PEN) 31G X 8 MM MISC As directed.  100 each  3  . levothyroxine (SYNTHROID) 88 MCG tablet Take 1 tablet (88 mcg total) by mouth daily.  90 tablet  0  . levothyroxine (SYNTHROID, LEVOTHROID) 100 MCG tablet Take 1 tablet (100 mcg total) by mouth daily.  90 tablet  0  .  lisinopril (PRINIVIL,ZESTRIL) 5 MG tablet TAKE ONE TABLET BY MOUTH EVERY DAY  90 tablet  0  . metFORMIN (GLUCOPHAGE) 1000 MG tablet Take 1 tablet (1,000 mg total) by mouth 2 (two) times daily with a meal.  180 tablet  1  . Potassium Gluconate 595 MG TABS Take 2 tablets by mouth daily.       . [DISCONTINUED] Diphenhydramine-APAP, sleep, (EXCEDRIN PM) 38-500 MG TABS Take 1 tablet by mouth at bedtime as needed.         No current facility-administered medications for this visit.    ROS: Pertinent items are noted in HPI.  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, esp cats  Exam:    BP 134/78  Pulse 88  Resp 16  Ht 5' 3.5" (1.613 m)  Wt 196 lb (88.905 kg)  BMI 34.17 kg/m2  LMP 02/15/2012   Wt Readings from Last 3 Encounters:  11/06/12 196 lb (88.905 kg)  12/28/11 202 lb (91.627 kg)  07/18/11 200 lb (90.719 kg)     Ht Readings from Last 3 Encounters:  11/06/12 5' 3.5" (1.613 m)  12/28/10 5' 3.11" (1.603 m)  09/23/10 5'  3.11" (1.603 m)    General appearance: alert, cooperative and appears stated age Head: Normocephalic, without obvious abnormality, atraumatic Neck: no adenopathy, supple, symmetrical, trachea midline and thyroid not enlarged, symmetric, no tenderness/mass/nodules Lungs: clear to auscultation bilaterally Breasts: Inspection negative, No nipple retraction or dimpling, No nipple discharge or bleeding, No axillary or supraclavicular adenopathy, Normal to palpation without dominant masses Heart: regular rate and rhythm Abdomen: soft, non-tender; bowel sounds normal; no masses,  no organomegaly Extremities: extremities normal, atraumatic, no cyanosis or edema Skin: Skin color, texture, turgor normal. No rashes or lesions Lymph nodes: Cervical, supraclavicular, and axillary nodes normal. No abnormal inguinal nodes palpated Neurologic: Grossly normal   Pelvic: External genitalia:  no lesions              Urethra:  normal appearing urethra with no masses, tenderness or lesions              Bartholins and Skenes: normal                 Vagina: normal appearing vagina with normal color and discharge, no lesions              Cervix: normal appearance              Pap taken: yes        Bimanual Exam:  Uterus:  uterus is normal size, shape, consistency and nontender, mid, mobile                                      Adnexa: normal adnexa in size, nontender and no masses                                      Rectovaginal: Confirms                                      Anus:  normal sphincter tone, no lesions  A: normal perimenopausal exam     HTN, AODM, elevated cholesteral      P: mammogram  pap smear counseled on breast self exam, mammography screening, adequate intake of calcium and vitamin D, diet and exercise return annually or prn     An After Visit Summary was printed and given to the patient.

## 2012-11-06 NOTE — Patient Instructions (Signed)

## 2012-11-07 LAB — GC/CHLAMYDIA PROBE AMP, URINE: Chlamydia, Swab/Urine, PCR: NEGATIVE

## 2012-11-11 LAB — IPS PAP TEST WITH HPV

## 2012-11-14 ENCOUNTER — Other Ambulatory Visit: Payer: Self-pay

## 2012-11-18 ENCOUNTER — Ambulatory Visit (INDEPENDENT_AMBULATORY_CARE_PROVIDER_SITE_OTHER): Payer: 59 | Admitting: Family Medicine

## 2012-11-18 ENCOUNTER — Ambulatory Visit (HOSPITAL_BASED_OUTPATIENT_CLINIC_OR_DEPARTMENT_OTHER)
Admission: RE | Admit: 2012-11-18 | Discharge: 2012-11-18 | Disposition: A | Discharge status: Home / Self Care | Payer: 59 | Source: Ambulatory Visit | Attending: Family Medicine | Admitting: Family Medicine

## 2012-11-18 ENCOUNTER — Encounter: Payer: Self-pay | Admitting: Family Medicine

## 2012-11-18 VITALS — BP 108/72 | HR 94 | Temp 98.4°F | Ht 63.5 in | Wt 195.0 lb

## 2012-11-18 DIAGNOSIS — E119 Type 2 diabetes mellitus without complications: Secondary | ICD-10-CM | POA: Insufficient documentation

## 2012-11-18 DIAGNOSIS — F419 Anxiety disorder, unspecified: Secondary | ICD-10-CM

## 2012-11-18 DIAGNOSIS — F32A Depression, unspecified: Secondary | ICD-10-CM

## 2012-11-18 DIAGNOSIS — R05 Cough: Secondary | ICD-10-CM | POA: Insufficient documentation

## 2012-11-18 DIAGNOSIS — E039 Hypothyroidism, unspecified: Secondary | ICD-10-CM

## 2012-11-18 DIAGNOSIS — R7401 Elevation of levels of liver transaminase levels: Secondary | ICD-10-CM

## 2012-11-18 DIAGNOSIS — Z0000 Encounter for general adult medical examination without abnormal findings: Secondary | ICD-10-CM

## 2012-11-18 DIAGNOSIS — R059 Cough, unspecified: Secondary | ICD-10-CM | POA: Insufficient documentation

## 2012-11-18 DIAGNOSIS — I10 Essential (primary) hypertension: Secondary | ICD-10-CM

## 2012-11-18 DIAGNOSIS — E785 Hyperlipidemia, unspecified: Secondary | ICD-10-CM

## 2012-11-18 DIAGNOSIS — Z86010 Personal history of colonic polyps: Secondary | ICD-10-CM

## 2012-11-18 DIAGNOSIS — IMO0001 Reserved for inherently not codable concepts without codable children: Secondary | ICD-10-CM

## 2012-11-18 DIAGNOSIS — F341 Dysthymic disorder: Secondary | ICD-10-CM

## 2012-11-18 MED ORDER — VENLAFAXINE HCL 50 MG PO TABS: 50.0000 mg | ORAL_TABLET | Freq: Two times a day (BID) | ORAL | Status: DC

## 2012-11-18 NOTE — Patient Instructions (Addendum)
Labs prior to visit lipid, renal, cbc, tsh, hepatic, hgba1c   Diabetes and Exercise Regular exercise is important and can help:   Control blood glucose (sugar).  Decrease blood pressure.    Control blood lipids (cholesterol, triglycerides).  Improve overall health. BENEFITS FROM EXERCISE  Improved fitness.  Improved flexibility.  Improved endurance.  Increased bone density.  Weight control.  Increased muscle strength.  Decreased body fat.  Improvement of the body's use of insulin, a hormone.  Increased insulin sensitivity.  Reduction of insulin needs.  Reduced stress and tension.  Helps you feel better. People with diabetes who add exercise to their lifestyle gain additional benefits, including:  Weight loss.  Reduced appetite.  Improvement of the body's use of blood glucose.  Decreased risk factors for heart disease:  Lowering of cholesterol and triglycerides.  Raising the level of good cholesterol (high-density lipoproteins, HDL).  Lowering blood sugar.  Decreased blood pressure. TYPE 1 DIABETES AND EXERCISE  Exercise will usually lower your blood glucose.  If blood glucose is greater than 240 mg/dl, check urine ketones. If ketones are present, do not exercise.  Location of the insulin injection sites may need to be adjusted with exercise. Avoid injecting insulin into areas of the body that will be exercised. For example, avoid injecting insulin into:  The arms when playing tennis.  The legs when jogging. For more information, discuss this with your caregiver.  Keep a record of:  Food intake.  Type and amount of exercise.  Expected peak times of insulin action.  Blood glucose levels. Do this before, during, and after exercise. Review your records with your caregiver. This will help you to develop guidelines for adjusting food intake and insulin amounts.  TYPE 2 DIABETES AND EXERCISE  Regular physical activity can help control blood  glucose.  Exercise is important because it may:  Increase the body's sensitivity to insulin.  Improve blood glucose control.  Exercise reduces the risk of heart disease. It decreases serum cholesterol and triglycerides. It also lowers blood pressure.  Those who take insulin or oral hypoglycemic agents should watch for signs of hypoglycemia. These signs include dizziness, shaking, sweating, chills, and confusion.  Body water is lost during exercise. It must be replaced. This will help to avoid loss of body fluids (dehydration) or heat stroke. Be sure to talk to your caregiver before starting an exercise program to make sure it is safe for you. Remember, any activity is better than none.  Document Released: 07/15/2003 Document Revised: 07/17/2011 Document Reviewed: 10/29/2008 [facility] Patient Information 2014 [place], [place].

## 2012-11-19 LAB — RENAL FUNCTION PANEL
Albumin: 4.2 g/dL (ref 3.5–5.2)
BUN: 12 mg/dL (ref 6–23)
Calcium: 9.1 mg/dL (ref 8.4–10.5)
Glucose, Bld: 339 mg/dL — ABNORMAL HIGH (ref 70–99)
Phosphorus: 4.6 mg/dL (ref 2.3–4.6)

## 2012-11-19 LAB — HEPATIC FUNCTION PANEL
ALT: 26 U/L (ref 0–35)
Albumin: 4.2 g/dL (ref 3.5–5.2)
Total Protein: 6.8 g/dL (ref 6.0–8.3)

## 2012-11-19 LAB — LIPID PANEL
Cholesterol: 158 mg/dL (ref 0–200)
HDL: 37 mg/dL — ABNORMAL LOW (ref >39)
Total CHOL/HDL Ratio: 4.3 Ratio
VLDL: 43 mg/dL — ABNORMAL HIGH (ref 0–40)

## 2012-11-19 LAB — CBC
HCT: 36.7 % (ref 36.0–46.0)
Hemoglobin: 11.1 g/dL — ABNORMAL LOW (ref 12.0–15.0)
MCHC: 30.2 g/dL (ref 30.0–36.0)
MCV: 71.8 fL — ABNORMAL LOW (ref 78.0–100.0)
RDW: 17.3 % — ABNORMAL HIGH (ref 11.5–15.5)
WBC: 8 10*3/uL (ref 4.0–10.5)

## 2012-11-19 LAB — TSH: TSH: 2.572 u[IU]/mL (ref 0.350–4.500)

## 2012-11-19 LAB — HEMOGLOBIN A1C: Mean Plasma Glucose: 280 mg/dL — ABNORMAL HIGH (ref <117)

## 2012-11-20 ENCOUNTER — Encounter: Payer: Self-pay | Admitting: Family Medicine

## 2012-11-20 DIAGNOSIS — Z0000 Encounter for general adult medical examination without abnormal findings: Secondary | ICD-10-CM

## 2012-11-20 DIAGNOSIS — Z860100 Personal history of colon polyps, unspecified: Secondary | ICD-10-CM | POA: Insufficient documentation

## 2012-11-20 HISTORY — DX: Personal history of colonic polyps: Z86.010

## 2012-11-20 HISTORY — DX: Personal history of colon polyps, unspecified: Z86.0100

## 2012-11-20 HISTORY — DX: Encounter for general adult medical examination without abnormal findings: Z00.00

## 2012-11-20 NOTE — Assessment & Plan Note (Signed)
Poor control but acknowledges she has not been taking her meds routinely. Agrees to restart Victoza although at higher doses she has experienced some N/V she will let us know if not tolerating the eds. Minimize simple carbs, encouraged DASH diet

## 2012-11-20 NOTE — Assessment & Plan Note (Signed)
Well controlled, no changes 

## 2012-11-20 NOTE — Progress Notes (Signed)
Patient ID: Colleen Baldwin, female   DOB: [birth year], 52 y.o.   MRN: [id number] Colleen Baldwin [id number] 09-02-1960 11/20/2012      Progress Note-Follow Up  Subjective  Chief Complaint  Chief Complaint  Patient presents with  . Annual Exam    physical    HPI  Patient is a 52 year old Caucasian female who is here today annual exam. She is a poorly controlled diabetic who unfortunately has not been using her medications routinely. she's recently had a bad cough but it is improving. She still Timoptic throat but no productive cough has not been using occlusal because she notes at higher doses she is to nausea and vomiting issues. No fevers or chills or recent illness. No chest pain or palpitations. No shortness of breath but there has been some polyuria and polydipsia. No, GI or GU complaints noted. Taking medications other than the toes are as prescribed   Past Medical History  Diagnosis Date  . Hypertension   . Diabetes mellitus     Type II  uncontrolled  . Hypothyroidism     s/p thyroidectomy for goiter  . Hyperlipidemia   . Depression   . Dysplasia of cervix, low grade (CIN 1)     Cryo   . Asthma   . Preventative health care 11/20/2012    Past Surgical History  Procedure Laterality Date  . Cholecystectomy  05-2004  . Thyroidectomy  09/1994  . Iud removal  07/20/2008    Of embedded paraguard  . Tonsillectomy and adenoidectomy    . Laparoscopic cholecystectomy  2006    Family History  Problem Relation Age of Onset  . Cancer Mother     ?uterine  . Diabetes Father   . Thyroid disease Father   . Stroke Father   . Hypertension Sister   . Diabetes Maternal Grandmother     History   Social History  . Marital Status: Single    Spouse Name: N/A    Number of Children: 0  . Years of Education: N/A   Occupational History  . INTERIOR DESIGNER    Social History Main Topics  . Smoking status: Never Smoker   . Smokeless tobacco: Never Used  . Alcohol Use: No  . Drug Use:  No  . Sexually Active: No   Other Topics Concern  . Not on file   Social History Narrative  . No narrative on file    Current Outpatient Prescriptions on File Prior to Visit  Medication Sig Dispense Refill  . aspirin 81 MG tablet Take 81 mg by mouth daily.        . Aspirin-Acetaminophen-Caffeine (EXCEDRIN PO) Take by mouth as needed.      . calcium carbonate (OS-CAL) 600 MG TABS Take 600 mg by mouth 2 (two) times daily with meals.        . cetirizine (ZYRTEC) 10 MG tablet Take 1 tablet by mouth 1-2 times a week.       . CRESTOR 10 MG tablet Take 1 tablet by mouth at  bedtime  90 tablet  1  . ibuprofen (ADVIL,MOTRIN) 200 MG tablet Take 2-3 tablets by mouth up to 3 times a day as needed for pain.       . Insulin Pen Needle (B-D ULTRAFINE III SHORT PEN) 31G X 8 MM MISC As directed.  100 each  3  . levothyroxine (SYNTHROID) 88 MCG tablet Take 1 tablet (88 mcg total) by mouth daily.  90 tablet  0  . levothyroxine (  SYNTHROID, LEVOTHROID) 100 MCG tablet Take 1 tablet (100 mcg total) by mouth daily.  90 tablet  0  . lisinopril (PRINIVIL,ZESTRIL) 5 MG tablet TAKE ONE TABLET BY MOUTH EVERY DAY  90 tablet  0  . metFORMIN (GLUCOPHAGE) 1000 MG tablet Take 1 tablet (1,000 mg total) by mouth 2 (two) times daily with a meal.  180 tablet  1  . Potassium Gluconate 595 MG TABS Take 2 tablets by mouth daily.       . [DISCONTINUED] Diphenhydramine-APAP, sleep, (EXCEDRIN PM) 38-500 MG TABS Take 1 tablet by mouth at bedtime as needed.         No current facility-administered medications on file prior to visit.    Allergies  Allergen Reactions  . Shrimp (Shellfish Allergy) Swelling    Review of Systems  Review of Systems  Constitutional: Positive for malaise/fatigue. Negative for fever.  HENT: Negative for congestion.   Eyes: Negative for pain and discharge.  Respiratory: Positive for cough. Negative for shortness of breath.   Cardiovascular: Negative for chest pain, palpitations and leg swelling.   Gastrointestinal: Negative for nausea, abdominal pain and diarrhea.  Genitourinary: Negative for dysuria.  Musculoskeletal: Negative for falls.  Skin: Negative for rash.  Neurological: Negative for loss of consciousness and headaches.  Endo/Heme/Allergies: Negative for polydipsia.  Psychiatric/Behavioral: Negative for depression and suicidal ideas. The patient is not nervous/anxious and does not have insomnia.     Objective  BP 108/72  Pulse 94  Temp(Src) 98.4 F (36.9 C) (Oral)  Ht 5' 3.5" (1.613 m)  Wt 195 lb (88.451 kg)  BMI 34 kg/m2  SpO2 94%  LMP 02/15/2012  Physical Exam  Physical Exam  Constitutional: She is oriented to person, place, and time and well-developed, well-nourished, and in no distress. No distress.  HENT:  Head: Normocephalic and atraumatic.  Eyes: Conjunctivae are normal.  Neck: Neck supple. No thyromegaly present.  Cardiovascular: Normal rate and regular rhythm.  Exam reveals no gallop.   No murmur heard. Pulmonary/Chest: Effort normal and breath sounds normal. She has no wheezes.  Abdominal: She exhibits no distension and no mass.  Musculoskeletal: She exhibits no edema.  Lymphadenopathy:    She has no cervical adenopathy.  Neurological: She is alert and oriented to person, place, and time.  Skin: Skin is warm and dry. No rash noted. She is not diaphoretic.  Psychiatric: Memory, affect and judgment normal.    Lab Results  Component Value Date   TSH 2.572 11/18/2012   Lab Results  Component Value Date   WBC 8.0 11/18/2012   HGB 11.1* 11/18/2012   HCT 36.7 11/18/2012   MCV 71.8* 11/18/2012   PLT 346 11/18/2012   Lab Results  Component Value Date   CREATININE 0.54 11/18/2012   BUN 12 11/18/2012   NA 133* 11/18/2012   K 4.3 11/18/2012   CL 97 11/18/2012   CO2 24 11/18/2012   Lab Results  Component Value Date   ALT 26 11/18/2012   AST 18 11/18/2012   ALKPHOS 131* 11/18/2012   BILITOT 0.4 11/18/2012   Lab Results  Component Value Date   CHOL  158 11/18/2012   Lab Results  Component Value Date   HDL 37* 11/18/2012   Lab Results  Component Value Date   LDLCALC 78 11/18/2012   Lab Results  Component Value Date   TRIG 217* 11/18/2012   Lab Results  Component Value Date   CHOLHDL 4.3 11/18/2012     Assessment & Plan  HYPERTENSION Well  controlled, no changes.   TRANSAMINASES, SERUM, ELEVATED Mild likely related to fatty liver disease avoid simple carbs and trans fats, increase exercise and fiber in the diet.   HYPERLIPIDEMIA Tolerating Crestor, add krill oil and avoid trans fats  DIABETES MELLITUS, UNCONTROLLED Poor control but acknowledges she has not been taking her meds routinely. Agrees to restart Victoza although at higher doses she has experienced some N/V she will let us know if not tolerating the eds. Minimize simple carbs, encouraged DASH diet  Anxiety and depression Will start Venlafaxine 50 mg po bid and consider titrating up to tid as needed  UNSPECIFIED HYPOTHYROIDISM Well controlled, no changes  Preventative health care Encouraged DASH diet, regular exercise, 8 hours of sleep daily

## 2012-11-20 NOTE — Assessment & Plan Note (Signed)
Tolerating Crestor, add krill oil and avoid trans fats

## 2012-11-20 NOTE — Assessment & Plan Note (Signed)
Will start Venlafaxine 50 mg po bid and consider titrating up to tid as needed

## 2012-11-20 NOTE — Assessment & Plan Note (Signed)
Mild likely related to fatty liver disease avoid simple carbs and trans fats, increase exercise and fiber in the diet.

## 2012-11-20 NOTE — Assessment & Plan Note (Signed)
Encouraged DASH diet, regular exercise, 8 hours of sleep daily

## 2012-11-20 NOTE — Assessment & Plan Note (Signed)
Is poised to have repeat colonoscopy in 2016

## 2012-11-26 MED ORDER — SAXAGLIPTIN HCL 5 MG PO TABS: 5.0000 mg | ORAL_TABLET | Freq: Every day | ORAL | Status: DC

## 2012-11-26 MED ORDER — ROSUVASTATIN CALCIUM 20 MG PO TABS: 20.0000 mg | ORAL_TABLET | Freq: Every day | ORAL | Status: DC

## 2012-11-26 MED ORDER — METFORMIN HCL 1000 MG PO TABS: 1000.0000 mg | ORAL_TABLET | Freq: Two times a day (BID) | ORAL | Status: DC

## 2012-11-26 NOTE — Addendum Note (Signed)
Addended by: Court Joy on: 11/26/2012 10:22 AM   Modules accepted: Orders

## 2012-11-27 MED ORDER — LEVOTHYROXINE SODIUM 200 MCG PO TABS: 200.0000 ug | ORAL_TABLET | Freq: Every day | ORAL | Status: DC

## 2012-11-27 NOTE — Addendum Note (Signed)
Addended by: Court Joy on: 11/27/2012 11:57 AM   Modules accepted: Orders

## 2012-11-28 NOTE — Progress Notes (Signed)
Quick Note:  Patient Informed and voiced understanding ______ 

## 2012-12-02 ENCOUNTER — Telehealth: Payer: Self-pay | Admitting: Obstetrics and Gynecology

## 2012-12-02 ENCOUNTER — Telehealth: Payer: Self-pay | Admitting: *Deleted

## 2012-12-02 NOTE — Telephone Encounter (Signed)
Patient wants to speak with nurse re: "Rx for something to help with itching and menopausal symptoms?" [facility] [address]

## 2012-12-02 NOTE — Telephone Encounter (Signed)
Received fax from [facility]/Cover My Meds for PA on Onglyza 5 mg; completed form and returned fax for approval to [facility] at [phone]/SLS

## 2012-12-02 NOTE — Telephone Encounter (Signed)
Patient notified of Dr. Tresa Res reccomendation of Monistat 3 OTC. Patient instructed to call if symptoms persist for OV.

## 2012-12-02 NOTE — Telephone Encounter (Signed)
I rec she try Monistat 3 OTC.  If sx's persist, OV.

## 2012-12-02 NOTE — Telephone Encounter (Signed)
Patient calling of concern of vaginal itching  Of the interior of the vulva. Patient is requesting topoical medication for itching and rawness sensation. States she knows her blood sugars are elevated and is on medication and working on compliance of blood sugar. States having some sensations of hot flashes but thinks is also due to thyroid history. Not sexually active at this time. Separated from spouse.  Patient last AEX with Dr. Tresa Res 11/06/2012 Please advise.

## 2012-12-04 NOTE — Telephone Encounter (Signed)
Received fax from [facility] stating "prior authorization is NOT needed"; faxed to pharmacy/SLS

## 2013-01-09 IMAGING — CR DG WRIST COMPLETE 3+V*L*
4 series · 4 of 4 positions shown · non-contrast
Comparison: No priors.

CLINICAL DATA: Left sided wrist pain for past 8 weeks.

LEFT WRIST - COMPLETE 3+ VIEW

[x wrist pa left]
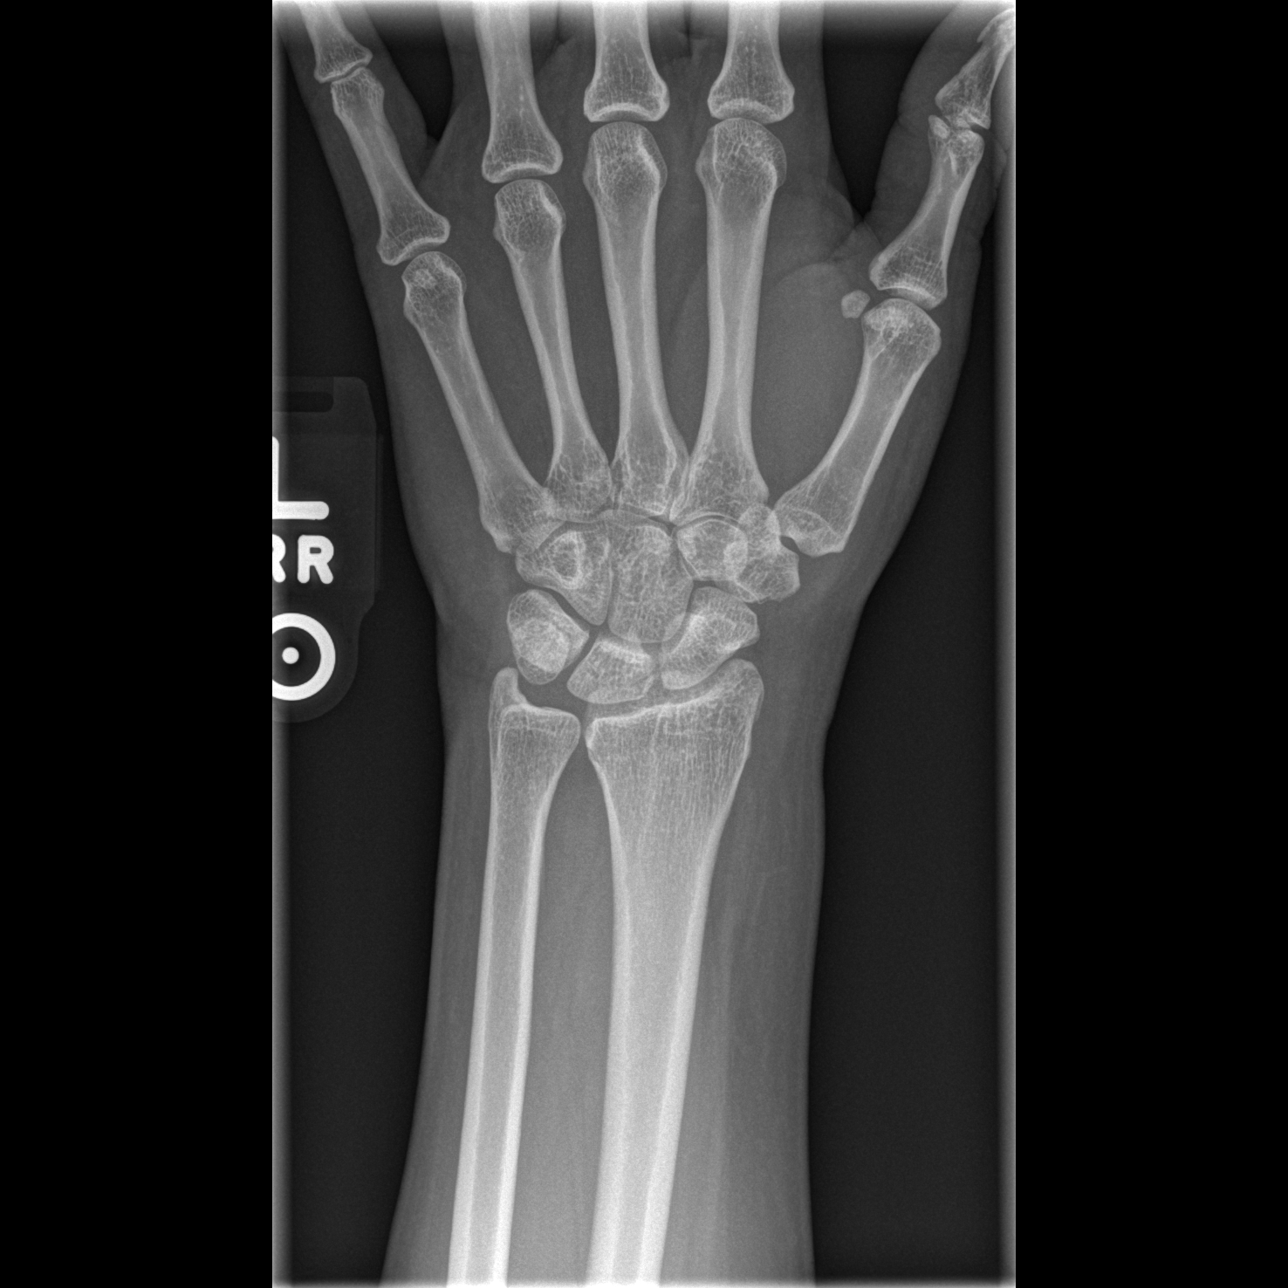

[x wrist obl left]
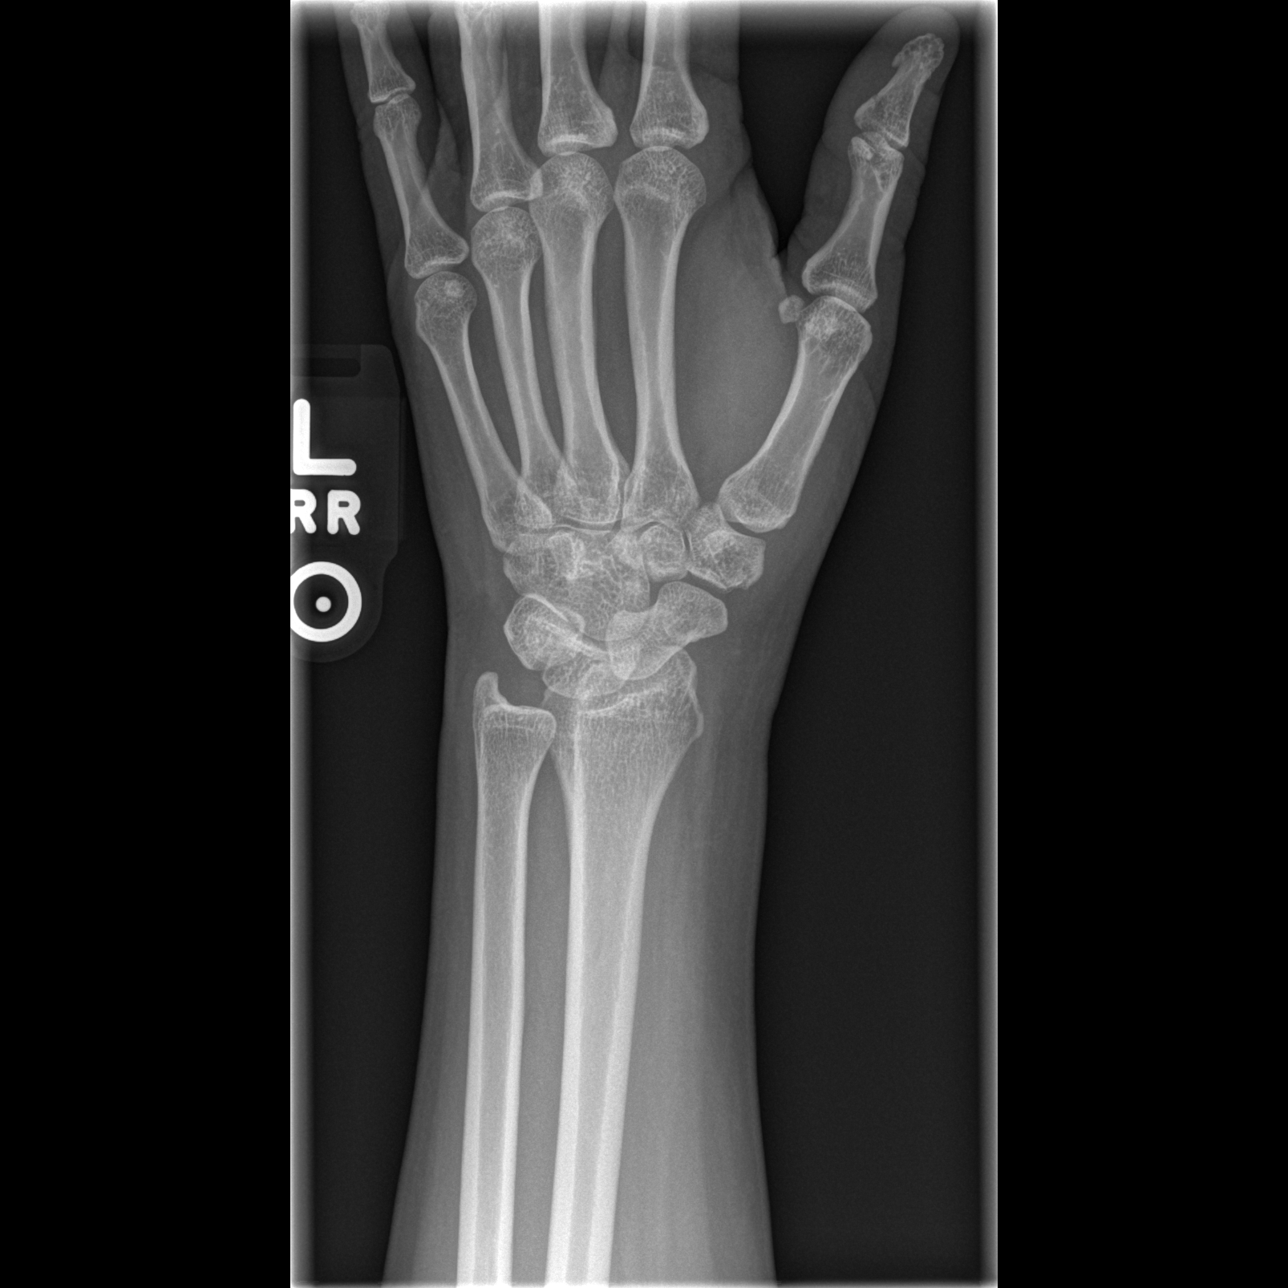

[x wrist lat left]
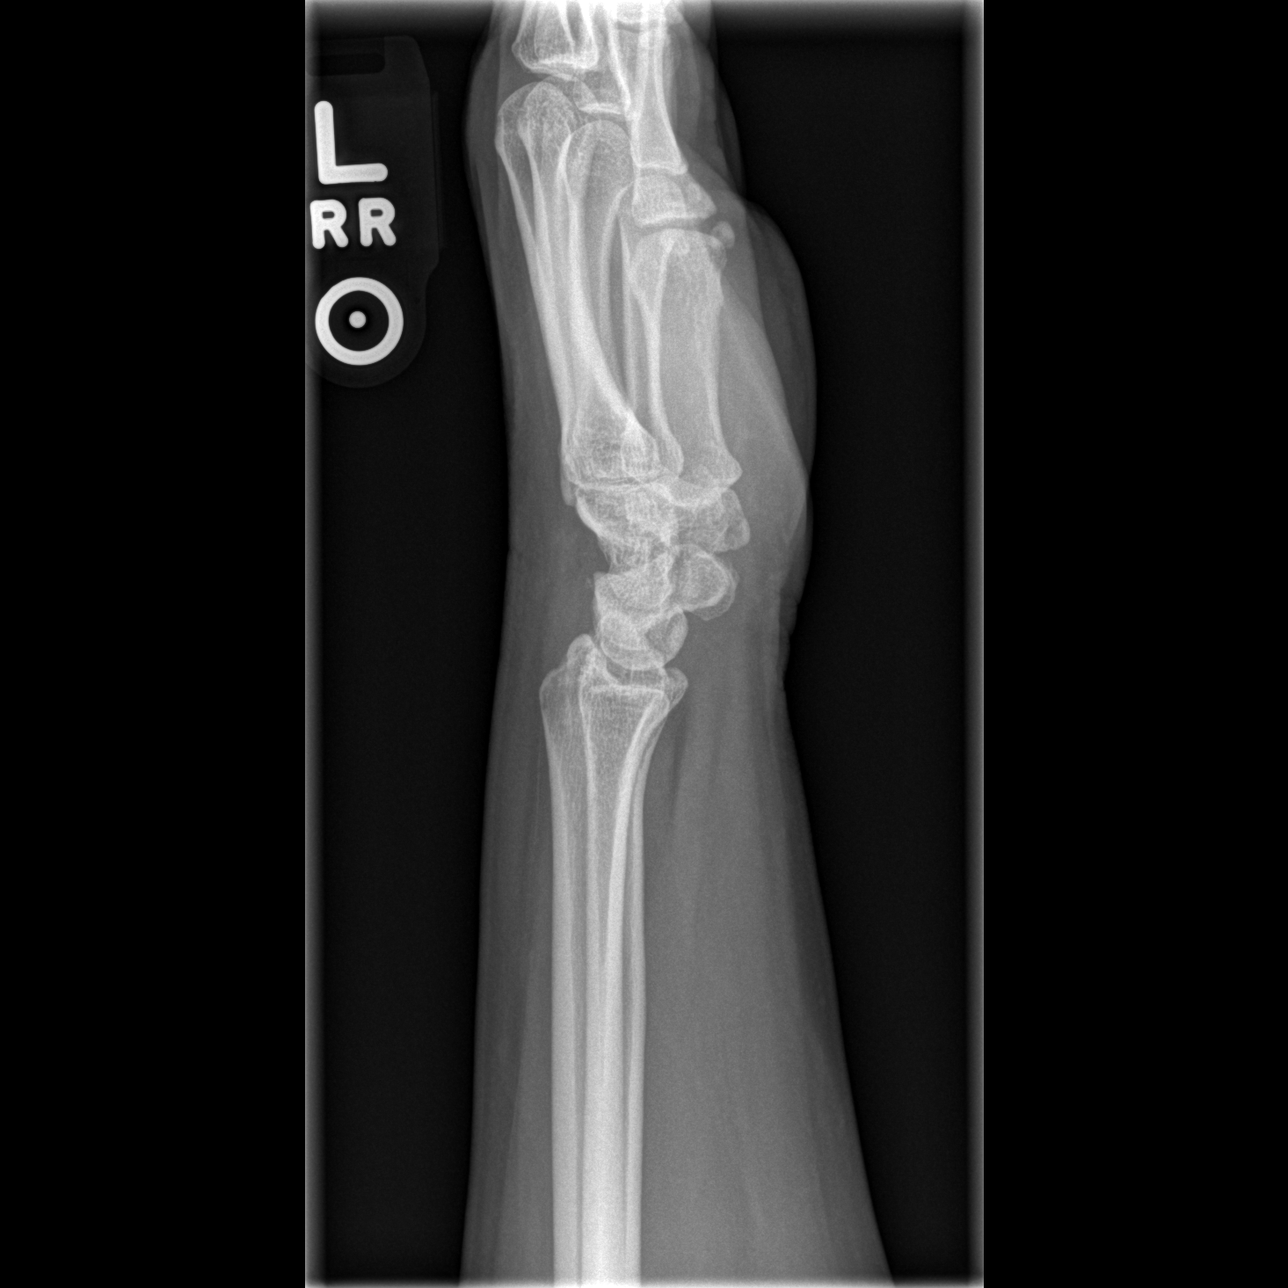

[x navicular]
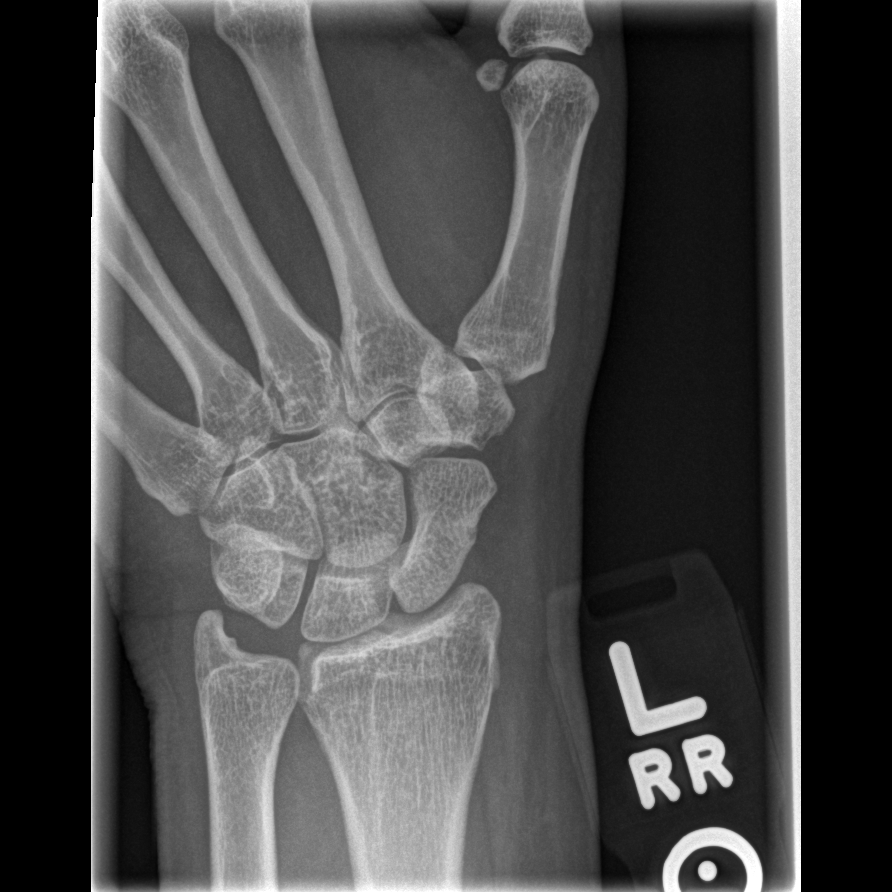

[4 of 4 positions shown; findings below may reference images not displayed]

FINDINGS: Three views of the left wrist demonstrate no acute
fracture, subluxation, dislocation, joint or soft tissue
abnormality.
IMPRESSION: 1.  No acute radiographic abnormality of the left wrist.

## 2013-01-20 ENCOUNTER — Other Ambulatory Visit: Payer: Self-pay | Admitting: *Deleted

## 2013-01-20 MED ORDER — LISINOPRIL 5 MG PO TABS: ORAL_TABLET | ORAL | Status: DC

## 2013-01-20 NOTE — Telephone Encounter (Signed)
Rx request to pharmacy/SLS  

## 2013-02-18 ENCOUNTER — Encounter: Payer: Self-pay | Admitting: Gynecology

## 2013-02-20 ENCOUNTER — Ambulatory Visit: Payer: 59 | Admitting: Family Medicine

## 2013-03-12 ENCOUNTER — Other Ambulatory Visit: Payer: Self-pay | Admitting: Family Medicine

## 2013-03-25 ENCOUNTER — Other Ambulatory Visit: Payer: Self-pay | Admitting: Family Medicine

## 2013-05-15 ENCOUNTER — Other Ambulatory Visit: Payer: Self-pay | Admitting: Family Medicine

## 2013-06-10 ENCOUNTER — Ambulatory Visit (INDEPENDENT_AMBULATORY_CARE_PROVIDER_SITE_OTHER): Payer: 59 | Admitting: Family Medicine

## 2013-06-10 ENCOUNTER — Encounter: Payer: Self-pay | Admitting: Family Medicine

## 2013-06-10 VITALS — BP 140/90 | HR 93 | Temp 98.3°F | Ht 63.5 in | Wt 195.0 lb

## 2013-06-10 DIAGNOSIS — F32A Depression, unspecified: Secondary | ICD-10-CM

## 2013-06-10 DIAGNOSIS — IMO0001 Reserved for inherently not codable concepts without codable children: Secondary | ICD-10-CM

## 2013-06-10 DIAGNOSIS — F329 Major depressive disorder, single episode, unspecified: Secondary | ICD-10-CM

## 2013-06-10 DIAGNOSIS — E039 Hypothyroidism, unspecified: Secondary | ICD-10-CM

## 2013-06-10 DIAGNOSIS — F341 Dysthymic disorder: Secondary | ICD-10-CM

## 2013-06-10 DIAGNOSIS — E119 Type 2 diabetes mellitus without complications: Secondary | ICD-10-CM

## 2013-06-10 DIAGNOSIS — I10 Essential (primary) hypertension: Secondary | ICD-10-CM

## 2013-06-10 DIAGNOSIS — M26609 Unspecified temporomandibular joint disorder, unspecified side: Secondary | ICD-10-CM

## 2013-06-10 DIAGNOSIS — D649 Anemia, unspecified: Secondary | ICD-10-CM

## 2013-06-10 DIAGNOSIS — K7689 Other specified diseases of liver: Secondary | ICD-10-CM

## 2013-06-10 DIAGNOSIS — F419 Anxiety disorder, unspecified: Secondary | ICD-10-CM

## 2013-06-10 DIAGNOSIS — E1165 Type 2 diabetes mellitus with hyperglycemia: Secondary | ICD-10-CM

## 2013-06-10 DIAGNOSIS — E785 Hyperlipidemia, unspecified: Secondary | ICD-10-CM

## 2013-06-10 HISTORY — DX: Anemia, unspecified: D64.9

## 2013-06-10 HISTORY — DX: Unspecified temporomandibular joint disorder, unspecified side: M26.609

## 2013-06-10 LAB — RENAL FUNCTION PANEL
Albumin: 4.4 g/dL (ref 3.5–5.2)
BUN: 9 mg/dL (ref 6–23)
CO2: 26 mEq/L (ref 19–32)
Calcium: 9.2 mg/dL (ref 8.4–10.5)
Chloride: 99 mEq/L (ref 96–112)
Creat: 0.42 mg/dL — ABNORMAL LOW (ref 0.50–1.10)
Glucose, Bld: 274 mg/dL — ABNORMAL HIGH (ref 70–99)
Phosphorus: 4.5 mg/dL (ref 2.3–4.6)
Potassium: 4.5 mEq/L (ref 3.5–5.3)
Sodium: 136 mEq/L (ref 135–145)

## 2013-06-10 LAB — CBC
HCT: 39 % (ref 36.0–46.0)
Hemoglobin: 12.2 g/dL (ref 12.0–15.0)
MCH: 22.5 pg — ABNORMAL LOW (ref 26.0–34.0)
MCHC: 31.3 g/dL (ref 30.0–36.0)
MCV: 72 fL — ABNORMAL LOW (ref 78.0–100.0)
Platelets: 353 10*3/uL (ref 150–400)
RBC: 5.42 MIL/uL — ABNORMAL HIGH (ref 3.87–5.11)
RDW: 19.4 % — ABNORMAL HIGH (ref 11.5–15.5)
WBC: 9.9 10*3/uL (ref 4.0–10.5)

## 2013-06-10 LAB — HEPATIC FUNCTION PANEL
ALT: 24 U/L (ref 0–35)
AST: 23 U/L (ref 0–37)
Albumin: 4.4 g/dL (ref 3.5–5.2)
Alkaline Phosphatase: 119 U/L — ABNORMAL HIGH (ref 39–117)
BILIRUBIN INDIRECT: 0.2 mg/dL (ref 0.2–1.2)
Bilirubin, Direct: 0.1 mg/dL (ref 0.0–0.3)
TOTAL PROTEIN: 7.1 g/dL (ref 6.0–8.3)
Total Bilirubin: 0.3 mg/dL (ref 0.2–1.2)

## 2013-06-10 LAB — LIPID PANEL
Cholesterol: 171 mg/dL (ref 0–200)
HDL: 54 mg/dL (ref >39)
LDL Cholesterol: 98 mg/dL (ref 0–99)
Total CHOL/HDL Ratio: 3.2 Ratio
Triglycerides: 96 mg/dL (ref <150)
VLDL: 19 mg/dL (ref 0–40)

## 2013-06-10 MED ORDER — VENLAFAXINE HCL 50 MG PO TABS
50.0000 mg | ORAL_TABLET | Freq: Three times a day (TID) | ORAL | Status: DC
Start: 2013-06-10 — End: 2014-04-08

## 2013-06-10 MED ORDER — GLUCOSE BLOOD VI STRP
ORAL_STRIP | Status: DC
Start: 2013-06-10 — End: 2014-06-24

## 2013-06-10 MED ORDER — METFORMIN HCL 1000 MG PO TABS: 1000.0000 mg | ORAL_TABLET | Freq: Two times a day (BID) | ORAL | Status: DC

## 2013-06-10 MED ORDER — LEVOTHYROXINE SODIUM 200 MCG PO TABS: 200.0000 ug | ORAL_TABLET | Freq: Every day | ORAL | Status: DC

## 2013-06-10 MED ORDER — ROSUVASTATIN CALCIUM 20 MG PO TABS: 20.0000 mg | ORAL_TABLET | Freq: Every day | ORAL | Status: DC

## 2013-06-10 MED ORDER — LISINOPRIL 5 MG PO TABS: ORAL_TABLET | ORAL | Status: DC

## 2013-06-10 NOTE — Progress Notes (Signed)
Patient ID: Colleen Baldwin, female   DOB: 04-19-1961, 53 y.o.   MRN: [id number] Colleen Baldwin [id number] 1960/11/17 06/10/2013      Progress Note-Follow Up  Subjective  Chief Complaint  Chief Complaint  Patient presents with  . Follow-up    med refills    HPI  Patient is a 53 year old female in today for follow up. She acknowledges she has not been taking Onglyza and is using Victoza infrequently. Has polyuria and polydipsia. No CP/palp/SOB/GI or GU. She has been under a great deal of stress with a recent move and divorce. She is complaining of some pain in her jaw bilaterally described as a sharp pain basically at the temporomandibular joint which radiates down to her lower jaw at times. Describes it as sharp in bilateral when it occurs denies any dental pain. Denies any fevers, chills, headache  Past Medical History  Diagnosis Date  . Hypertension   . Diabetes mellitus     Type II  uncontrolled  . Hypothyroidism     s/p thyroidectomy for goiter  . Hyperlipidemia   . Depression   . Dysplasia of cervix, low grade (CIN 1)     Cryo   . Asthma   . Preventative health care 11/20/2012  . Personal history of colonic polyps 11/20/2012    Follows with [facility] in [place]    Past Surgical History  Procedure Laterality Date  . Cholecystectomy  05-2004  . Thyroidectomy  09/1994  . Iud removal  07/20/2008    Of embedded paraguard  . Tonsillectomy and adenoidectomy    . Laparoscopic cholecystectomy  2006    Family History  Problem Relation Age of Onset  . Cancer Mother     ?uterine  . Diabetes Father   . Thyroid disease Father   . Stroke Father   . Hypertension Sister   . Diabetes Maternal Grandmother     History   Social History  . Marital Status: Single    Spouse Name: N/A    Number of Children: 0  . Years of Education: N/A   Occupational History  . INTERIOR DESIGNER    Social History Main Topics  . Smoking status: Never Smoker   . Smokeless tobacco:  Never Used  . Alcohol Use: No  . Drug Use: No  . Sexual Activity: No   Other Topics Concern  . Not on file   Social History Narrative  . No narrative on file    Current Outpatient Prescriptions on File Prior to Visit  Medication Sig Dispense Refill  . aspirin 81 MG tablet Take 81 mg by mouth daily.        . Aspirin-Acetaminophen-Caffeine (EXCEDRIN PO) Take by mouth as needed.      . calcium carbonate (OS-CAL) 600 MG TABS Take 600 mg by mouth 2 (two) times daily with meals.        . cetirizine (ZYRTEC) 10 MG tablet Take 1 tablet by mouth 1-2 times a week.       Marland Kitchen ibuprofen (ADVIL,MOTRIN) 200 MG tablet Take 2-3 tablets by mouth up to 3 times a day as needed for pain.       . Insulin Pen Needle (B-D ULTRAFINE III SHORT PEN) 31G X 8 MM MISC As directed.  100 each  3  . Potassium Gluconate 595 MG TABS Take 2 tablets by mouth daily.       . saxagliptin HCl (ONGLYZA) 5 MG TABS tablet Take 1 tablet (5 mg total) by  mouth daily.  90 tablet  1  . [DISCONTINUED] Diphenhydramine-APAP, sleep, (EXCEDRIN PM) 38-500 MG TABS Take 1 tablet by mouth at bedtime as needed.         No current facility-administered medications on file prior to visit.    Allergies  Allergen Reactions  . Shrimp [Shellfish Allergy] Swelling    Review of Systems  Review of Systems  Constitutional: Negative for fever and malaise/fatigue.  HENT: Negative for congestion.   Eyes: Negative for discharge.  Respiratory: Negative for shortness of breath.   Cardiovascular: Negative for chest pain, palpitations and leg swelling.  Gastrointestinal: Negative for nausea, abdominal pain and diarrhea.  Genitourinary: Negative for dysuria.  Musculoskeletal: Negative for falls.  Skin: Negative for rash.  Neurological: Negative for loss of consciousness and headaches.  Endo/Heme/Allergies: Negative for polydipsia.  Psychiatric/Behavioral: Negative for depression and suicidal ideas. The patient is not nervous/anxious and does not  have insomnia.     Objective  BP 140/90  Pulse 93  Temp(Src) 98.3 F (36.8 C) (Oral)  Ht 5' 3.5" (1.613 m)  Wt 195 lb (88.451 kg)  BMI 34.00 kg/m2  SpO2 97%  Physical Exam  Physical Exam  Lab Results  Component Value Date   TSH 2.572 11/18/2012   Lab Results  Component Value Date   WBC 8.0 11/18/2012   HGB 11.1* 11/18/2012   HCT 36.7 11/18/2012   MCV 71.8* 11/18/2012   PLT 346 11/18/2012   Lab Results  Component Value Date   CREATININE 0.54 11/18/2012   BUN 12 11/18/2012   NA 133* 11/18/2012   K 4.3 11/18/2012   CL 97 11/18/2012   CO2 24 11/18/2012   Lab Results  Component Value Date   ALT 26 11/18/2012   AST 18 11/18/2012   ALKPHOS 131* 11/18/2012   BILITOT 0.4 11/18/2012   Lab Results  Component Value Date   CHOL 158 11/18/2012   Lab Results  Component Value Date   HDL 37* 11/18/2012   Lab Results  Component Value Date   LDLCALC 78 11/18/2012   Lab Results  Component Value Date   TRIG 217* 11/18/2012   Lab Results  Component Value Date   CHOLHDL 4.3 11/18/2012     Assessment & Plan  HYPERTENSION Borderline numbers, patient agreed to attempt the DASH diet and increase exercise which she acknowledges she has not done since last visit.  FATTY LIVER DISEASE Minimize simple carbs, saturated fats, no trans fats, check LFTs today  UNSPECIFIED HYPOTHYROIDISM Given refill on Levothyroxine and check TSH today  DIABETES MELLITUS, UNCONTROLLED Discussed at length how important it is to control her sugar. She is warned about heart, kidney, brain disease etc. She is offered referral to endocrinology but declines. Has not been taking her Onglyza and has only been Victoza at 0.6 a couple times a week. Has been taking Metformin bid. Agrees to start Onglyza daily and continue Metformin. Avoid simple carbs, eat protein with each meal, start checking sugars bid and report any concerning numbers  HYPERLIPIDEMIA Tolerating Crestor daily, given refills today, avoid trans  fats.  Anemia Mild, repeat CBC increase leafy greens and red meat  TMJ dysfunction Encouraged mouth guard and discuss with dentist, can try massages with Salon Pas or Aspercreme

## 2013-06-10 NOTE — Assessment & Plan Note (Signed)
Given refill on Levothyroxine and check TSH today

## 2013-06-10 NOTE — Addendum Note (Signed)
Addended by: Varney Daily on: 06/10/2013 02:46 PM   Modules accepted: Orders

## 2013-06-10 NOTE — Assessment & Plan Note (Signed)
Mild, repeat CBC increase leafy greens and red meat

## 2013-06-10 NOTE — Assessment & Plan Note (Signed)
Encouraged mouth guard and discuss with dentist, can try massages with Salon Pas or Aspercreme

## 2013-06-10 NOTE — Patient Instructions (Signed)
DASH Diet The DASH diet stands for "Dietary Approaches to Stop Hypertension." It is a healthy eating plan that has been shown to reduce high blood pressure (hypertension) in as little as 14 days, while also possibly providing other significant health benefits. These other health benefits include reducing the risk of breast cancer after menopause and reducing the risk of type 2 diabetes, heart disease, colon cancer, and stroke. Health benefits also include weight loss and slowing kidney failure in patients with chronic kidney disease.  DIET GUIDELINES  Limit salt (sodium). Your diet should contain less than 1500 mg of sodium daily.  Limit refined or processed carbohydrates. Your diet should include mostly whole grains. Desserts and added sugars should be used sparingly.  Include small amounts of heart-healthy fats. These types of fats include nuts, oils, and tub margarine. Limit saturated and trans fats. These fats have been shown to be harmful in the body. CHOOSING FOODS  The following food groups are based on a 2000 calorie diet. See your Registered Dietitian for individual calorie needs. Grains and Grain Products (6 to 8 servings daily)  Eat More Often: Whole-wheat bread, brown rice, whole-grain or wheat pasta, quinoa, popcorn without added fat or salt (air popped).  Eat Less Often: White bread, white pasta, white rice, cornbread. Vegetables (4 to 5 servings daily)  Eat More Often: Fresh, frozen, and canned vegetables. Vegetables may be raw, steamed, roasted, or grilled with a minimal amount of fat.  Eat Less Often/Avoid: Creamed or fried vegetables. Vegetables in a cheese sauce. Fruit (4 to 5 servings daily)  Eat More Often: All fresh, canned (in natural juice), or frozen fruits. Dried fruits without added sugar. One hundred percent fruit juice ( cup [237 mL] daily).  Eat Less Often: Dried fruits with added sugar. Canned fruit in light or heavy syrup. [employer], Fish, and Poultry (2  servings or less daily. One serving is 3 to 4 oz [85-114 g]).  Eat More Often: Ninety percent or leaner ground beef, tenderloin, sirloin. Round cuts of beef, chicken breast, [place] breast. All fish. Grill, bake, or broil your meat. Nothing should be fried.  Eat Less Often/Avoid: Fatty cuts of meat, [place], or chicken leg, thigh, or wing. Fried cuts of meat or fish. Dairy (2 to 3 servings)  Eat More Often: Low-fat or fat-free milk, low-fat plain or light yogurt, reduced-fat or part-skim cheese.  Eat Less Often/Avoid: Milk (whole, 2%).Whole milk yogurt. Full-fat cheeses. Nuts, Seeds, and Legumes (4 to 5 servings per week)  Eat More Often: All without added salt.  Eat Less Often/Avoid: Salted nuts and seeds, canned beans with added salt. Fats and Sweets (limited)  Eat More Often: Vegetable oils, tub margarines without trans fats, sugar-free gelatin. Mayonnaise and salad dressings.  Eat Less Often/Avoid: Coconut oils, palm oils, butter, stick margarine, cream, half and half, cookies, candy, pie. FOR MORE INFORMATION The Dash Diet Eating Plan: www.dashdiet.org Document Released: 04/13/2011 Document Revised: 07/17/2011 Document Reviewed: 04/13/2011 [facility] Patient Information 2014 [place], [place]. Temporomandibular Problems  Temporomandibular joint (TMJ) dysfunction means there are problems with the joint between your jaw and your skull. This is a joint lined by cartilage like other joints in your body but also has a small disc in the joint which keeps the bones from rubbing on each other. These joints are like other joints and can get inflamed (sore) from arthritis and other problems. When this joint gets sore, it can cause headaches and pain in the jaw and the face. CAUSES  Usually the  arthritic types of problems are caused by soreness in the joint. Soreness in the joint can also be caused by overuse. This may come from grinding your teeth. It may also come from mis-alignment in the  joint. DIAGNOSIS Diagnosis of this condition can often be made by history and exam. Sometimes your caregiver may need X-rays or an MRI scan to determine the exact cause. It may be necessary to see your dentist to determine if your teeth and jaws are lined up correctly. TREATMENT  Most of the time this problem is not serious; however, sometimes it can persist (become chronic). When this happens medications that will cut down on inflammation (soreness) help. Sometimes a shot of cortisone into the joint will be helpful. If your teeth are not aligned it may help for your dentist to make a splint for your mouth that can help this problem. If no physical problems can be found, the problem may come from tension. If tension is found to be the cause, biofeedback or relaxation techniques may be helpful. HOME CARE INSTRUCTIONS   Later in the day, applications of ice packs may be helpful. Ice can be used in a plastic bag with a towel around it to prevent frostbite to skin. This may be used about every 2 hours for 20 to 30 minutes, as needed while awake, or as directed by your caregiver.  Only take over-the-counter or prescription medicines for pain, discomfort, or fever as directed by your caregiver.  If physical therapy was prescribed, follow your caregiver's directions.  Wear mouth appliances as directed if they were given. Document Released: 01/17/2001 Document Revised: 07/17/2011 Document Reviewed: 04/26/2008 [facility] Patient Information 2014 [place], [place].

## 2013-06-10 NOTE — Assessment & Plan Note (Signed)
Minimize simple carbs, saturated fats, no trans fats, check LFTs today

## 2013-06-10 NOTE — Assessment & Plan Note (Signed)
Tolerating Crestor daily, given refills today, avoid trans fats.

## 2013-06-10 NOTE — Assessment & Plan Note (Signed)
Borderline numbers, patient agreed to attempt the DASH diet and increase exercise which she acknowledges she has not done since last visit.

## 2013-06-10 NOTE — Assessment & Plan Note (Signed)
Discussed at length how important it is to control her sugar. She is warned about heart, kidney, brain disease etc. She is offered referral to endocrinology but declines. Has not been taking her Onglyza and has only been Victoza at 0.6 a couple times a week. Has been taking Metformin bid. Agrees to start Onglyza daily and continue Metformin. Avoid simple carbs, eat protein with each meal, start checking sugars bid and report any concerning numbers

## 2013-06-10 NOTE — Progress Notes (Signed)
Pre visit review using our clinic review tool, if applicable. No additional management support is needed unless otherwise documented below in the visit note. 

## 2013-06-11 ENCOUNTER — Telehealth: Payer: Self-pay | Admitting: Family Medicine

## 2013-06-11 LAB — TSH: TSH: 2.083 u[IU]/mL (ref 0.350–4.500)

## 2013-06-11 LAB — HEMOGLOBIN A1C
Hgb A1c MFr Bld: 11.2 % — ABNORMAL HIGH (ref <5.7)
Mean Plasma Glucose: 275 mg/dL — ABNORMAL HIGH (ref <117)

## 2013-06-11 NOTE — Telephone Encounter (Signed)
Relevant patient education assigned to patient using Emmi. ° °

## 2013-06-13 ENCOUNTER — Telehealth: Payer: Self-pay

## 2013-06-13 NOTE — Telephone Encounter (Signed)
Relevant patient education assigned to patient using Emmi. ° °

## 2013-09-11 ENCOUNTER — Ambulatory Visit: Payer: 59 | Admitting: Family Medicine

## 2013-10-17 ENCOUNTER — Ambulatory Visit (INDEPENDENT_AMBULATORY_CARE_PROVIDER_SITE_OTHER): Payer: 59 | Admitting: Family Medicine

## 2013-10-17 ENCOUNTER — Encounter: Payer: Self-pay | Admitting: Family Medicine

## 2013-10-17 ENCOUNTER — Other Ambulatory Visit (HOSPITAL_BASED_OUTPATIENT_CLINIC_OR_DEPARTMENT_OTHER): Payer: Self-pay | Admitting: Obstetrics and Gynecology

## 2013-10-17 VITALS — BP 138/87 | HR 103 | Temp 98.4°F | Resp 18 | Ht 63.5 in | Wt 196.0 lb

## 2013-10-17 DIAGNOSIS — F419 Anxiety disorder, unspecified: Secondary | ICD-10-CM

## 2013-10-17 DIAGNOSIS — IMO0001 Reserved for inherently not codable concepts without codable children: Secondary | ICD-10-CM

## 2013-10-17 DIAGNOSIS — I10 Essential (primary) hypertension: Secondary | ICD-10-CM

## 2013-10-17 DIAGNOSIS — R202 Paresthesia of skin: Secondary | ICD-10-CM

## 2013-10-17 DIAGNOSIS — E1165 Type 2 diabetes mellitus with hyperglycemia: Secondary | ICD-10-CM

## 2013-10-17 DIAGNOSIS — F341 Dysthymic disorder: Secondary | ICD-10-CM

## 2013-10-17 DIAGNOSIS — L578 Other skin changes due to chronic exposure to nonionizing radiation: Secondary | ICD-10-CM

## 2013-10-17 DIAGNOSIS — E119 Type 2 diabetes mellitus without complications: Secondary | ICD-10-CM

## 2013-10-17 DIAGNOSIS — R209 Unspecified disturbances of skin sensation: Secondary | ICD-10-CM

## 2013-10-17 DIAGNOSIS — F32A Depression, unspecified: Secondary | ICD-10-CM

## 2013-10-17 DIAGNOSIS — F329 Major depressive disorder, single episode, unspecified: Secondary | ICD-10-CM

## 2013-10-17 DIAGNOSIS — Z1231 Encounter for screening mammogram for malignant neoplasm of breast: Secondary | ICD-10-CM

## 2013-10-17 DIAGNOSIS — E785 Hyperlipidemia, unspecified: Secondary | ICD-10-CM

## 2013-10-17 DIAGNOSIS — R200 Anesthesia of skin: Secondary | ICD-10-CM

## 2013-10-17 DIAGNOSIS — E039 Hypothyroidism, unspecified: Secondary | ICD-10-CM

## 2013-10-17 NOTE — Patient Instructions (Signed)

## 2013-10-22 ENCOUNTER — Ambulatory Visit (HOSPITAL_BASED_OUTPATIENT_CLINIC_OR_DEPARTMENT_OTHER)
Admission: RE | Admit: 2013-10-22 | Discharge: 2013-10-22 | Disposition: A | Discharge status: Home / Self Care | Payer: 59 | Source: Ambulatory Visit | Attending: Obstetrics and Gynecology | Admitting: Obstetrics and Gynecology

## 2013-10-22 DIAGNOSIS — Z1231 Encounter for screening mammogram for malignant neoplasm of breast: Secondary | ICD-10-CM | POA: Insufficient documentation

## 2013-10-22 DIAGNOSIS — R928 Other abnormal and inconclusive findings on diagnostic imaging of breast: Secondary | ICD-10-CM | POA: Insufficient documentation

## 2013-10-22 LAB — RENAL FUNCTION PANEL
Albumin: 4.1 g/dL (ref 3.5–5.2)
BUN: 10 mg/dL (ref 6–23)
CO2: 26 mEq/L (ref 19–32)
CREATININE: 0.56 mg/dL (ref 0.50–1.10)
Calcium: 8.8 mg/dL (ref 8.4–10.5)
Chloride: 96 mEq/L (ref 96–112)
Glucose, Bld: 338 mg/dL — ABNORMAL HIGH (ref 70–99)
PHOSPHORUS: 4.3 mg/dL (ref 2.3–4.6)
Potassium: 4.4 mEq/L (ref 3.5–5.3)
Sodium: 134 mEq/L — ABNORMAL LOW (ref 135–145)

## 2013-10-22 LAB — HEPATIC FUNCTION PANEL
ALBUMIN: 4.1 g/dL (ref 3.5–5.2)
ALT: 23 U/L (ref 0–35)
AST: 22 U/L (ref 0–37)
Alkaline Phosphatase: 140 U/L — ABNORMAL HIGH (ref 39–117)
BILIRUBIN INDIRECT: 0.3 mg/dL (ref 0.2–1.2)
Bilirubin, Direct: 0.1 mg/dL (ref 0.0–0.3)
TOTAL PROTEIN: 6.8 g/dL (ref 6.0–8.3)
Total Bilirubin: 0.4 mg/dL (ref 0.2–1.2)

## 2013-10-22 LAB — CBC
HCT: 39.4 % (ref 36.0–46.0)
HEMOGLOBIN: 12.9 g/dL (ref 12.0–15.0)
MCH: 25.6 pg — ABNORMAL LOW (ref 26.0–34.0)
MCHC: 32.7 g/dL (ref 30.0–36.0)
MCV: 78.3 fL (ref 78.0–100.0)
Platelets: 323 10*3/uL (ref 150–400)
RBC: 5.03 MIL/uL (ref 3.87–5.11)
RDW: 15.2 % (ref 11.5–15.5)
WBC: 7.6 10*3/uL (ref 4.0–10.5)

## 2013-10-22 LAB — HEMOGLOBIN A1C
Hgb A1c MFr Bld: 12.3 % — ABNORMAL HIGH (ref <5.7)
Mean Plasma Glucose: 306 mg/dL — ABNORMAL HIGH (ref <117)

## 2013-10-22 LAB — LIPID PANEL
Cholesterol: 177 mg/dL (ref 0–200)
HDL: 48 mg/dL (ref >39)
LDL Cholesterol: 94 mg/dL (ref 0–99)
TRIGLYCERIDES: 173 mg/dL — AB (ref <150)
Total CHOL/HDL Ratio: 3.7 Ratio
VLDL: 35 mg/dL (ref 0–40)

## 2013-10-22 LAB — TSH: TSH: 2.461 u[IU]/mL (ref 0.350–4.500)

## 2013-10-24 ENCOUNTER — Encounter: Payer: Self-pay | Admitting: Family Medicine

## 2013-10-24 ENCOUNTER — Other Ambulatory Visit: Payer: Self-pay | Admitting: Obstetrics & Gynecology

## 2013-10-24 DIAGNOSIS — R200 Anesthesia of skin: Secondary | ICD-10-CM | POA: Insufficient documentation

## 2013-10-24 DIAGNOSIS — R928 Other abnormal and inconclusive findings on diagnostic imaging of breast: Secondary | ICD-10-CM

## 2013-10-24 DIAGNOSIS — L578 Other skin changes due to chronic exposure to nonionizing radiation: Secondary | ICD-10-CM | POA: Insufficient documentation

## 2013-10-24 DIAGNOSIS — R202 Paresthesia of skin: Secondary | ICD-10-CM

## 2013-10-24 NOTE — Assessment & Plan Note (Signed)
Referred to dermatology for further evaluation.

## 2013-10-24 NOTE — Assessment & Plan Note (Signed)
Encouraged heart healthy diet, increase exercise, avoid trans fats, consider a krill oil cap daily 

## 2013-10-24 NOTE — Progress Notes (Signed)
Patient ID: Colleen Baldwin, female   DOB: Nov 24, 1960, 53 y.o.   MRN: [id number] Colleen Baldwin [id number] Sep 06, 1960 10/24/2013      Progress Note-Follow Up  Subjective  Chief Complaint  Chief Complaint  Patient presents with  . Follow-up  . Numbness    right hand    HPI  .Patient is a 53 year old female in today for routine medical care. Here today for followup. Complaining of some numbness in her right hand but denies any injury it is primarily located on her palm. No redness swelling or warmth. She stopped Shelda Pal for sugar. Sheet knowledge of some polyuria or polydipsia. He lesion on her back which is mildly itchy. Denies CP/palp/SOB/HA/congestion/fevers/GI or GU c/o. Taking meds as prescribed  Past Medical History  Diagnosis Date  . Hypertension   . Diabetes mellitus     Type II  uncontrolled  . Hypothyroidism     s/p thyroidectomy for goiter  . Hyperlipidemia   . Depression   . Dysplasia of cervix, low grade (CIN 1)     Cryo   . Asthma   . Preventative health care 11/20/2012  . Personal history of colonic polyps 11/20/2012    Follows with [facility] in [place]  . Anemia 06/10/2013  . TMJ dysfunction 06/10/2013    Past Surgical History  Procedure Laterality Date  . Cholecystectomy  05-2004  . Thyroidectomy  09/1994  . Iud removal  07/20/2008    Of embedded paraguard  . Tonsillectomy and adenoidectomy    . Laparoscopic cholecystectomy  2006    Family History  Problem Relation Age of Onset  . Cancer Mother     ?uterine  . Diabetes Father   . Thyroid disease Father   . Stroke Father   . Hypertension Sister   . Diabetes Maternal Grandmother     History   Social History  . Marital Status: Single    Spouse Name: N/A    Number of Children: 0  . Years of Education: N/A   Occupational History  . INTERIOR DESIGNER    Social History Main Topics  . Smoking status: Never Smoker   . Smokeless tobacco: Never Used  . Alcohol Use: No  . Drug Use: No  .  Sexual Activity: No   Other Topics Concern  . Not on file   Social History Narrative  . No narrative on file    Current Outpatient Prescriptions on File Prior to Visit  Medication Sig Dispense Refill  . aspirin 81 MG tablet Take 81 mg by mouth daily.        . Aspirin-Acetaminophen-Caffeine (EXCEDRIN PO) Take by mouth as needed.      . calcium carbonate (OS-CAL) 600 MG TABS Take 600 mg by mouth 2 (two) times daily with meals.        . cetirizine (ZYRTEC) 10 MG tablet Take 1 tablet by mouth 1-2 times a week.       Marland Kitchen glucose blood (ACCU-CHEK AVIVA) test strip Check BS bid and prn.  DX: 250.02  100 each  3  . ibuprofen (ADVIL,MOTRIN) 200 MG tablet Take 2-3 tablets by mouth up to 3 times a day as needed for pain.       . Insulin Pen Needle (B-D ULTRAFINE III SHORT PEN) 31G X 8 MM MISC As directed.  100 each  3  . Iron Combinations (IRON COMPLEX PO) Take 27 mg by mouth daily.      Marland Kitchen levothyroxine (SYNTHROID, LEVOTHROID) 200 MCG tablet  Take 1 tablet (200 mcg total) by mouth daily before breakfast.  90 tablet  3  . lisinopril (PRINIVIL,ZESTRIL) 5 MG tablet TAKE ONE TABLET BY MOUTH EVERY DAY  90 tablet  3  . metFORMIN (GLUCOPHAGE) 1000 MG tablet Take 1 tablet (1,000 mg total) by mouth 2 (two) times daily with a meal.  180 tablet  1  . Multiple Vitamin (DAILY VITAMINS PO) Take by mouth.      . Potassium Gluconate 595 MG TABS Take 2 tablets by mouth daily.       . rosuvastatin (CRESTOR) 20 MG tablet Take 1 tablet (20 mg total) by mouth daily.  90 tablet  1  . venlafaxine (EFFEXOR) 50 MG tablet Take 1 tablet (50 mg total) by mouth 3 (three) times daily with meals.  90 tablet  5  . [DISCONTINUED] Diphenhydramine-APAP, sleep, (EXCEDRIN PM) 38-500 MG TABS Take 1 tablet by mouth at bedtime as needed.         No current facility-administered medications on file prior to visit.    Allergies  Allergen Reactions  . Shrimp [Shellfish Allergy] Swelling    Review of Systems  Review of Systems   Constitutional: Negative for fever and malaise/fatigue.  HENT: Negative for congestion.   Eyes: Negative for discharge.  Respiratory: Negative for shortness of breath.   Cardiovascular: Negative for chest pain, palpitations and leg swelling.  Gastrointestinal: Negative for nausea, abdominal pain and diarrhea.  Genitourinary: Negative for dysuria.  Musculoskeletal: Negative for falls.  Skin: Positive for rash.       Irregular lesion on back  Neurological: Positive for sensory change. Negative for loss of consciousness and headaches.  Endo/Heme/Allergies: Negative for polydipsia.  Psychiatric/Behavioral: Negative for depression and suicidal ideas. The patient is not nervous/anxious and does not have insomnia.     Objective  BP 138/87  Pulse 103  Temp(Src) 98.4 F (36.9 C) (Oral)  Resp 18  Ht 5' 3.5" (1.613 m)  Wt 196 lb (88.905 kg)  BMI 34.17 kg/m2  SpO2 98%  Physical Exam  Physical Exam  Constitutional: She is oriented to person, place, and time and well-developed, well-nourished, and in no distress. No distress.  HENT:  Head: Normocephalic and atraumatic.  Eyes: Conjunctivae are normal.  Neck: Neck supple. No thyromegaly present.  Cardiovascular: Normal rate, regular rhythm and normal heart sounds.   No murmur heard. Pulmonary/Chest: Effort normal and breath sounds normal. She has no wheezes.  Abdominal: She exhibits no distension and no mass.  Musculoskeletal: She exhibits no edema.  Lymphadenopathy:    She has no cervical adenopathy.  Neurological: She is alert and oriented to person, place, and time.  Skin: Skin is warm and dry. No rash noted. She is not diaphoretic.  Psychiatric: Memory, affect and judgment normal.    Lab Results  Component Value Date   TSH 2.461 10/22/2013   Lab Results  Component Value Date   WBC 7.6 10/22/2013   HGB 12.9 10/22/2013   HCT 39.4 10/22/2013   MCV 78.3 10/22/2013   PLT 323 10/22/2013   Lab Results  Component Value Date    CREATININE 0.56 10/22/2013   BUN 10 10/22/2013   NA 134* 10/22/2013   K 4.4 10/22/2013   CL 96 10/22/2013   CO2 26 10/22/2013   Lab Results  Component Value Date   ALT 23 10/22/2013   AST 22 10/22/2013   ALKPHOS 140* 10/22/2013   BILITOT 0.4 10/22/2013   Lab Results  Component Value Date   CHOL  177 10/22/2013   Lab Results  Component Value Date   HDL 48 10/22/2013   Lab Results  Component Value Date   LDLCALC 94 10/22/2013   Lab Results  Component Value Date   TRIG 173* 10/22/2013   Lab Results  Component Value Date   CHOLHDL 3.7 10/22/2013     Assessment & Plan  HYPERTENSION Well controlled, no changes to meds. Encouraged heart healthy diet such as the DASH diet and exercise as tolerated.   UNSPECIFIED HYPOTHYROIDISM tsh wnl  HYPERLIPIDEMIA Encouraged heart healthy diet, increase exercise, avoid trans fats, consider a krill oil cap daily  Anxiety and depression Stable on Effexor  Numbness and tingling in right hand Ordered EMG to investigate  DIABETES MELLITUS, UNCONTROLLED Poorly controlled. Patient resistant to change in meds. Encouraged to minimize carbs and increase exercise.  Sun-damaged skin Referred to dermatology for further evaluation

## 2013-10-24 NOTE — Assessment & Plan Note (Signed)
Well controlled, no changes to meds. Encouraged heart healthy diet such as the DASH diet and exercise as tolerated.  °

## 2013-10-24 NOTE — Assessment & Plan Note (Signed)
tsh wnl

## 2013-10-24 NOTE — Assessment & Plan Note (Signed)
Poorly controlled. Patient resistant to change in meds. Encouraged to minimize carbs and increase exercise.

## 2013-10-24 NOTE — Assessment & Plan Note (Signed)
Stable on Effexor

## 2013-10-24 NOTE — Assessment & Plan Note (Signed)
Ordered EMG to investigate

## 2013-10-27 ENCOUNTER — Telehealth: Payer: Self-pay | Admitting: *Deleted

## 2013-10-27 NOTE — Telephone Encounter (Signed)
LMOM with contact name and number for return call RE: results and further provider instructions/SLS  

## 2013-10-27 NOTE — Telephone Encounter (Signed)
Message copied by Rockwell [place] on Mon Oct 27, 2013  5:20 PM ------      Message from: Penni Homans A      Created: Fri Oct 24, 2013 10:35 PM       Please let her know her A1C is very high she needs to increase Metformin 1000 mg tabs to 1 tab po bid and 1/2 tab in midday and she needs to add Glimeperide 2 mg po bid disp #60 with 3. Or I can refer to endocrinology ------

## 2013-10-28 NOTE — Telephone Encounter (Signed)
Patient left message returning your call this morning at 9:42am, she can be reached today @ [phone]

## 2013-10-29 NOTE — Telephone Encounter (Signed)
Called pt, her vm sounds like a work number. Didn't leave message, will call back later.

## 2013-11-03 ENCOUNTER — Encounter (INDEPENDENT_AMBULATORY_CARE_PROVIDER_SITE_OTHER): Payer: Self-pay

## 2013-11-03 ENCOUNTER — Ambulatory Visit
Admission: RE | Admit: 2013-11-03 | Discharge: 2013-11-03 | Disposition: A | Discharge status: Home / Self Care | Payer: 59 | Source: Ambulatory Visit | Attending: Obstetrics & Gynecology | Admitting: Obstetrics & Gynecology

## 2013-11-03 DIAGNOSIS — R928 Other abnormal and inconclusive findings on diagnostic imaging of breast: Secondary | ICD-10-CM

## 2013-11-03 NOTE — Telephone Encounter (Signed)
LMOM with contact name and number for return call RE: and further provider   

## 2013-11-05 ENCOUNTER — Encounter: Payer: Self-pay | Admitting: *Deleted

## 2013-11-05 NOTE — Telephone Encounter (Signed)
LMOM [2nd & 3rd attempt to reach [patient via telephone] with contact name and number for return call RE: Results Letter will now be mailed with results and further provider instructions/SLS

## 2013-11-06 ENCOUNTER — Ambulatory Visit: Payer: 59 | Admitting: Gynecology

## 2013-11-10 ENCOUNTER — Encounter: Payer: Self-pay | Admitting: Gynecology

## 2013-11-10 ENCOUNTER — Ambulatory Visit (INDEPENDENT_AMBULATORY_CARE_PROVIDER_SITE_OTHER): Payer: 59 | Admitting: Gynecology

## 2013-11-10 VITALS — BP 114/62 | HR 100 | Resp 20 | Ht 63.5 in | Wt 194.0 lb

## 2013-11-10 DIAGNOSIS — Z01419 Encounter for gynecological examination (general) (routine) without abnormal findings: Secondary | ICD-10-CM

## 2013-11-10 DIAGNOSIS — Z0000 Encounter for general adult medical examination without abnormal findings: Secondary | ICD-10-CM

## 2013-11-10 LAB — POCT URINALYSIS DIPSTICK
Bilirubin, UA: NEGATIVE
Glucose, UA: 1000
Leukocytes, UA: NEGATIVE
Nitrite, UA: NEGATIVE
Protein, UA: NEGATIVE
RBC UA: NEGATIVE
UROBILINOGEN UA: NEGATIVE
pH, UA: 5

## 2013-11-10 NOTE — Progress Notes (Signed)
53 y.o. Divorced Caucasian female   G1P0010 here for annual exam. She does report hot flashes, does not have night sweats, does have vaginal dryness.  She is using lubricants.  She does not report post-menopasual bleeding.  Sexually active with long distance relationship, new partner. No condoms.  No LMP recorded. Patient is not currently having periods (Reason: Perimenopausal).          Sexually active: Yes.    The current method of family planning is condoms sometimes.    Exercising: No.  The patient does not participate in regular exercise at present. Last pap: 11/2012. Neg. HR HPV neg Abnormal PAP: yes Mammogram: 10/24/13 BIRADS3, 80m recall BSE: No Colonoscopy: 2013 - Polyps repeat 2018 DEXA: Never Alcohol: Less than 1 drink a week Tobacco: No  Health Maintenance  Topic Date Due  . Foot Exam  08/10/1971  . Urine Microalbumin  07/30/2012  . Ophthalmology Exam  12/27/2012  . Influenza Vaccine  12/06/2013  . Hemoglobin A1c  04/23/2014  . Tetanus/tdap  05/08/2014  . Pneumococcal Polysaccharide Vaccine (##2) 05/12/2015  . Mammogram  11/04/2015  . Pap Smear  11/07/2015  . Colonoscopy  12/27/2021    Family History  Problem Relation Age of Onset  . Cancer Mother     ?uterine  . Diabetes Father   . Thyroid disease Father   . Stroke Father   . Hypertension Sister   . Diabetes Maternal Grandmother     Patient Active Problem List   Diagnosis Date Noted  . Numbness and tingling in right hand 10/24/2013  . Sun-damaged skin 10/24/2013  . Anemia 06/10/2013  . TMJ dysfunction 06/10/2013  . Preventative health care 11/20/2012  . Personal history of colonic polyps 11/20/2012  . Wrist pain 12/28/2011  . Anxiety and depression 09/23/2010  . FATTY LIVER DISEASE 05/12/2010  . HYPERTENSION 12/06/2009  . TRANSAMINASES, SERUM, ELEVATED 05/13/2009  . UNSPECIFIED HYPOTHYROIDISM 05/11/2009  . DIABETES MELLITUS, UNCONTROLLED 05/11/2009  . HYPERLIPIDEMIA 05/11/2009    Past Medical  History  Diagnosis Date  . Hypertension   . Diabetes mellitus     Type II  uncontrolled  . Hypothyroidism     s/p thyroidectomy for goiter  . Hyperlipidemia   . Depression   . Dysplasia of cervix, low grade (CIN 1)     Cryo   . Asthma   . Preventative health care 11/20/2012  . Personal history of colonic polyps 11/20/2012    Follows with [facility] [place]  . Anemia 06/10/2013  . TMJ dysfunction 06/10/2013    Past Surgical History  Procedure Laterality Date  . Cholecystectomy  05-2004  . Thyroidectomy  09/1994  . Iud removal  07/20/2008    Of embedded paraguard  . Tonsillectomy and adenoidectomy    . Laparoscopic cholecystectomy  2006    Allergies: Shrimp  Current Outpatient Prescriptions  Medication Sig Dispense Refill  . aspirin 81 MG tablet Take 81 mg by mouth daily.        . Aspirin-Acetaminophen-Caffeine (EXCEDRIN PO) Take by mouth as needed.      . calcium carbonate (OS-CAL) 600 MG TABS Take 600 mg by mouth 2 (two) times daily with meals.        . cetirizine (ZYRTEC) 10 MG tablet Take 1 tablet by mouth 1-2 times a week.       Marland Kitchen glucose blood (ACCU-CHEK AVIVA) test strip Check BS bid and prn.  DX: 250.02  100 each  3  . Iron Combinations (IRON COMPLEX PO) Take  27 mg by mouth daily.      Marland Kitchen levothyroxine (SYNTHROID, LEVOTHROID) 200 MCG tablet Take 1 tablet (200 mcg total) by mouth daily before breakfast.  90 tablet  3  . lisinopril (PRINIVIL,ZESTRIL) 5 MG tablet TAKE ONE TABLET BY MOUTH EVERY DAY  90 tablet  3  . metFORMIN (GLUCOPHAGE) 1000 MG tablet Take 1 tablet (1,000 mg total) by mouth 2 (two) times daily with a meal.  180 tablet  1  . Multiple Vitamin (DAILY VITAMINS PO) Take by mouth.      . Potassium Gluconate 595 MG TABS Take 2 tablets by mouth daily.       . rosuvastatin (CRESTOR) 20 MG tablet Take 1 tablet (20 mg total) by mouth daily.  90 tablet  1  . venlafaxine (EFFEXOR) 50 MG tablet Take 1 tablet (50 mg total) by mouth 3 (three) times daily  with meals.  90 tablet  5  . ibuprofen (ADVIL,MOTRIN) 200 MG tablet Take 2-3 tablets by mouth up to 3 times a day as needed for pain.       . Insulin Pen Needle (B-D ULTRAFINE III SHORT PEN) 31G X 8 MM MISC As directed.  100 each  3  . saxagliptin HCl (ONGLYZA) 5 MG TABS tablet Take 5 mg by mouth daily.      . [DISCONTINUED] Diphenhydramine-APAP, sleep, (EXCEDRIN PM) 38-500 MG TABS Take 1 tablet by mouth at bedtime as needed.         No current facility-administered medications for this visit.    ROS: Pertinent items are noted in HPI.  Exam:    BP 114/62  Pulse 100  Resp 20  Ht 5' 3.5" (1.613 m)  Wt 194 lb (87.998 kg)  BMI 33.82 kg/m2 Weight change: @WEIGHTCHANGE @ Last 3 height recordings:  Ht Readings from Last 3 Encounters:  11/10/13 5' 3.5" (1.613 m)  10/17/13 5' 3.5" (1.613 m)  06/10/13 5' 3.5" (1.613 m)   General appearance: alert, cooperative and appears stated age Head: Normocephalic, without obvious abnormality, atraumatic Neck: no adenopathy, no carotid bruit, no JVD, supple, symmetrical, trachea midline and thyroid not enlarged, symmetric, no tenderness/mass/nodules Lungs: clear to auscultation bilaterally Breasts: normal appearance, no masses or tenderness Heart: regular rate and rhythm, S1, S2 normal, no murmur, click, rub or gallop Abdomen: soft, non-tender; bowel sounds normal; no masses,  no organomegaly Extremities: extremities normal, atraumatic, no cyanosis or edema Skin: Skin color, texture, turgor normal. No rashes or lesions Lymph nodes: Cervical, supraclavicular, and axillary nodes normal. no inguinal nodes palpated Neurologic: Grossly normal   Pelvic: External genitalia:  no lesions              Urethra: normal appearing urethra with no masses, tenderness or lesions              Bartholins and Skenes: Bartholin's, Urethra, Skene's normal                 Vagina: normal appearing vagina with normal color and discharge, no lesions              Cervix:  normal appearance              Pap taken: No.        Bimanual Exam:  Uterus:  uterus is normal size, shape, consistency and nontender  Adnexa:    normal adnexa in size, nontender and no masses                                      Rectovaginal: Confirms                                      Anus:  normal sphincter tone, no lesions     1. Routine gynecological examination mammogram counseled on breast self exam, mammography screening, adequate intake of calcium and vitamin D, diet and exercise Poorly controlled DM-multiple skin lacerations, pt to watch for signs of infection.  otc neosporin  return annually or prn Discussed PAP guideline changes, importance of weight bearing exercises, calcium, vit D and balanced diet.    2. Laboratory examination ordered as part of a routine general medical examination  - POCT Urinalysis Dipstick  P: An After Visit Summary was printed and given to the patient.

## 2013-11-19 ENCOUNTER — Ambulatory Visit (INDEPENDENT_AMBULATORY_CARE_PROVIDER_SITE_OTHER): Payer: 59 | Admitting: Diagnostic Neuroimaging

## 2013-11-19 ENCOUNTER — Encounter (INDEPENDENT_AMBULATORY_CARE_PROVIDER_SITE_OTHER): Payer: Self-pay

## 2013-11-19 DIAGNOSIS — R200 Anesthesia of skin: Secondary | ICD-10-CM

## 2013-11-19 DIAGNOSIS — R202 Paresthesia of skin: Principal | ICD-10-CM

## 2013-11-19 DIAGNOSIS — R209 Unspecified disturbances of skin sensation: Secondary | ICD-10-CM

## 2013-11-19 DIAGNOSIS — Z0289 Encounter for other administrative examinations: Secondary | ICD-10-CM

## 2013-11-19 NOTE — Procedures (Signed)
   [facility]  NCS (NERVE CONDUCTION STUDY) WITH EMG (ELECTROMYOGRAPHY) REPORT   STUDY DATE: 11/19/13 PATIENT NAME: Colleen Baldwin DOB: [birth year] MRN: [id number]  ORDERING CLINICIAN: Azalee Course  TECHNOLOGIST: Laretta Alstrom  ELECTROMYOGRAPHER: Earlean Polka. Penumalli, MD  CLINICAL INFORMATION: 53 year old female with diabetes, here with right hand numbness and pain.   FINDINGS: NERVE CONDUCTION STUDY: Bilateral median and ulnar motor responses and F-wave latencies are normal. Bilateral median and ulnar sensory responses are normal. Bilateral median and ulnar transcarpal mixed nerve responses are normal.  NEEDLE ELECTROMYOGRAPHY: Evaluation of right upper extremity deltoid, biceps, triceps, flexor carpi radialis, first dorsal interosseous muscles is normal.  IMPRESSION:  This is a normal study. No electrodiagnostic evidence of large fiber neuropathy at this time.   INTERPRETING PHYSICIAN:  Penni Bombard, MD Certified in Neurology, Neurophysiology and Neuroimaging  [facility] [address], [place], [place] [address] [phone]

## 2013-12-08 ENCOUNTER — Telehealth: Payer: Self-pay | Admitting: Gynecology

## 2013-12-08 NOTE — Telephone Encounter (Signed)
Patient says her partner commented on the amount of freckles she has "down there". Patient took a Geologist, engineering and looked and was horrified. Patient says "it looks as if someone dotted her with a marker". Patient says she was here recently and nothing was mentioned about the freckles.

## 2013-12-08 NOTE — Telephone Encounter (Signed)
Spoke with patient. Patient states that her partner stated that she had a lot of "freckles" in her vagina. Patient took a Geologist, engineering to take a look and states that it looks like someone took a "felt tip black/purlple marker and dotted me." "I was just seen at the beginning of July and I want to make sure this is normal for perimenopausal women. Or if I need to come in to be seen." Advise patient that it is best for her to come in to be seen for evaluation. Denies itching and burning. States she has no other symptoms. Offered appointment tomorrow at 1:30pm with Dr.Lathrop but patient declines due to schedule. Patient requesting Thursday or Friday appointment. Advised Dr.Lathrop's off day is Thursday but would check about scheduling for Friday with Dr.Lathrop and give patient a call back. Patient agreeable. Patient states that she only wants to see Dr.Lathrop.

## 2013-12-08 NOTE — Telephone Encounter (Signed)
Spoke with patient. Advised spoke with Dr.Lathrop. Appointment scheduled for Friday at 11am (time per provider). Patient agreeable to date and time.  Routing to provider for final review. Patient agreeable to disposition. Will close encounter

## 2013-12-09 ENCOUNTER — Other Ambulatory Visit: Payer: Self-pay | Admitting: Family Medicine

## 2013-12-12 ENCOUNTER — Ambulatory Visit (INDEPENDENT_AMBULATORY_CARE_PROVIDER_SITE_OTHER): Payer: 59 | Admitting: Gynecology

## 2013-12-12 ENCOUNTER — Encounter: Payer: Self-pay | Admitting: Gynecology

## 2013-12-12 VITALS — BP 138/74 | HR 78 | Resp 14 | Ht 63.5 in | Wt 194.0 lb

## 2013-12-12 DIAGNOSIS — D280 Benign neoplasm of vulva: Secondary | ICD-10-CM

## 2013-12-12 NOTE — Progress Notes (Signed)
Subjective:     Patient ID: Colleen Baldwin, female   DOB: 04/02/1961, 53 y.o.   MRN: [id number]  HPI Comments: Pt with new intimate partner who noted that there were lesions on her vulva.  Pt reports never inspecting before this time and wanted assurance that they were normal.  Pt has not pain, vaginal itching or discharge.  Pt has not been sexually active with new partner.      Review of Systems  Genitourinary: Negative for vaginal bleeding, vaginal discharge, genital sores, vaginal pain and dyspareunia.       Objective:   Physical Exam  Nursing note and vitals reviewed. Constitutional: She appears well-developed and well-nourished.  Genitourinary:     Lymphadenopathy:       Right: No inguinal adenopathy present.       Left: No inguinal adenopathy present.  Skin:  Multiple senile angiomas and freckles noted on upper back and abdomen       Assessment:     angiokeratosis     Plan:     Informed benign changes to vulva increase with age [place] to not treat or treat with laser Pt interested in assurance only  Questions addressed  46m spent couseling

## 2013-12-30 ENCOUNTER — Other Ambulatory Visit: Payer: Self-pay | Admitting: Family Medicine

## 2013-12-31 NOTE — Telephone Encounter (Signed)
Rx sent to pharmacy. LDM 

## 2014-03-09 ENCOUNTER — Encounter: Payer: Self-pay | Admitting: Gynecology

## 2014-03-17 ENCOUNTER — Encounter: Payer: 59 | Admitting: Family Medicine

## 2014-04-07 ENCOUNTER — Telehealth: Payer: Self-pay | Admitting: Gynecology

## 2014-04-07 NOTE — Telephone Encounter (Signed)
Left message to call regarding a cancelled aex appointment.

## 2014-04-08 ENCOUNTER — Other Ambulatory Visit: Payer: Self-pay | Admitting: Family Medicine

## 2014-05-07 ENCOUNTER — Telehealth: Payer: Self-pay | Admitting: *Deleted

## 2014-05-07 NOTE — Telephone Encounter (Addendum)
Recall Notes:  Right breast diagnostic mammogram in 6 months/ [facility]  Last MMG:  11/03/13, Probably benign calcifications located centrally and superiorly within the right breast.  Pt overdue for mammogram recall.  Due 04/2014.  Please call patient to schedule.  [facility].

## 2014-05-11 ENCOUNTER — Other Ambulatory Visit: Payer: Self-pay | Admitting: Obstetrics & Gynecology

## 2014-05-11 DIAGNOSIS — N6310 Unspecified lump in the right breast, unspecified quadrant: Secondary | ICD-10-CM

## 2014-05-12 NOTE — Telephone Encounter (Signed)
Patient has appt [facility]22/14 [facility]

## 2014-05-13 NOTE — Telephone Encounter (Signed)
Recall extended to 06/07/14.  Routing to provider for review.  Closing encounter.

## 2014-05-22 ENCOUNTER — Ambulatory Visit
Admission: RE | Admit: 2014-05-22 | Discharge: 2014-05-22 | Disposition: A | Discharge status: Home / Self Care | Payer: Managed Care, Other (non HMO) | Source: Ambulatory Visit | Attending: Obstetrics & Gynecology | Admitting: Obstetrics & Gynecology

## 2014-05-22 DIAGNOSIS — N6310 Unspecified lump in the right breast, unspecified quadrant: Secondary | ICD-10-CM

## 2014-06-01 ENCOUNTER — Other Ambulatory Visit: Payer: Self-pay | Admitting: Family Medicine

## 2014-06-01 NOTE — Telephone Encounter (Signed)
Rx sent to the pharmacy by e-script.  Pt was only given #30.//AB/CMA

## 2014-06-01 NOTE — Telephone Encounter (Signed)
Patient calling in on this. She scheduled cpe with Melissa. Did not want to wait until July.

## 2014-06-01 NOTE — Telephone Encounter (Signed)
Cannot make out which medicine the request is for.

## 2014-06-02 NOTE — Telephone Encounter (Signed)
Is this done?

## 2014-06-24 ENCOUNTER — Ambulatory Visit (INDEPENDENT_AMBULATORY_CARE_PROVIDER_SITE_OTHER): Payer: Managed Care, Other (non HMO) | Admitting: Family

## 2014-06-24 ENCOUNTER — Encounter: Payer: Self-pay | Admitting: Family

## 2014-06-24 VITALS — BP 130/80 | HR 88 | Temp 98.1°F | Resp 16 | Ht 63.5 in | Wt 191.4 lb

## 2014-06-24 DIAGNOSIS — F418 Other specified anxiety disorders: Secondary | ICD-10-CM

## 2014-06-24 DIAGNOSIS — F419 Anxiety disorder, unspecified: Secondary | ICD-10-CM

## 2014-06-24 DIAGNOSIS — Z23 Encounter for immunization: Secondary | ICD-10-CM

## 2014-06-24 DIAGNOSIS — Z0000 Encounter for general adult medical examination without abnormal findings: Secondary | ICD-10-CM

## 2014-06-24 DIAGNOSIS — F32A Depression, unspecified: Secondary | ICD-10-CM

## 2014-06-24 DIAGNOSIS — D649 Anemia, unspecified: Secondary | ICD-10-CM

## 2014-06-24 DIAGNOSIS — IMO0002 Reserved for concepts with insufficient information to code with codable children: Secondary | ICD-10-CM

## 2014-06-24 DIAGNOSIS — E890 Postprocedural hypothyroidism: Secondary | ICD-10-CM

## 2014-06-24 DIAGNOSIS — F329 Major depressive disorder, single episode, unspecified: Secondary | ICD-10-CM

## 2014-06-24 DIAGNOSIS — E1165 Type 2 diabetes mellitus with hyperglycemia: Secondary | ICD-10-CM

## 2014-06-24 LAB — CBC WITH DIFFERENTIAL/PLATELET
Basophils Absolute: 0 10*3/uL (ref 0.0–0.1)
Basophils Relative: 0.5 % (ref 0.0–3.0)
EOS PCT: 1.9 % (ref 0.0–5.0)
Eosinophils Absolute: 0.2 10*3/uL (ref 0.0–0.7)
HCT: 43.4 % (ref 36.0–46.0)
Hemoglobin: 14.8 g/dL (ref 12.0–15.0)
Lymphocytes Relative: 28.6 % (ref 12.0–46.0)
Lymphs Abs: 2.7 10*3/uL (ref 0.7–4.0)
MCHC: 34 g/dL (ref 30.0–36.0)
MCV: 80.7 fl (ref 78.0–100.0)
MONO ABS: 0.5 10*3/uL (ref 0.1–1.0)
Monocytes Relative: 5.2 % (ref 3.0–12.0)
NEUTROS PCT: 63.8 % (ref 43.0–77.0)
Neutro Abs: 6 10*3/uL (ref 1.4–7.7)
Platelets: 293 10*3/uL (ref 150.0–400.0)
RBC: 5.39 Mil/uL — AB (ref 3.87–5.11)
RDW: 15.5 % (ref 11.5–15.5)
WBC: 9.4 10*3/uL (ref 4.0–10.5)

## 2014-06-24 LAB — URINALYSIS, ROUTINE W REFLEX MICROSCOPIC
Bilirubin Urine: NEGATIVE
HGB URINE DIPSTICK: NEGATIVE
Ketones, ur: NEGATIVE
LEUKOCYTES UA: NEGATIVE
Nitrite: NEGATIVE
RBC / HPF: NONE SEEN (ref 0–?)
Specific Gravity, Urine: 1.02 (ref 1.000–1.030)
TOTAL PROTEIN, URINE-UPE24: NEGATIVE
UROBILINOGEN UA: 0.2 (ref 0.0–1.0)
pH: 5 (ref 5.0–8.0)

## 2014-06-24 LAB — HEPATIC FUNCTION PANEL
ALT: 25 U/L (ref 0–35)
AST: 22 U/L (ref 0–37)
Albumin: 4.2 g/dL (ref 3.5–5.2)
Alkaline Phosphatase: 115 U/L (ref 39–117)
BILIRUBIN DIRECT: 0.1 mg/dL (ref 0.0–0.3)
Total Bilirubin: 0.5 mg/dL (ref 0.2–1.2)
Total Protein: 7.3 g/dL (ref 6.0–8.3)

## 2014-06-24 LAB — LIPID PANEL
Cholesterol: 145 mg/dL (ref 0–200)
HDL: 44.9 mg/dL (ref >39.00)
LDL Cholesterol: 66 mg/dL (ref 0–99)
NONHDL: 100.1
Total CHOL/HDL Ratio: 3
Triglycerides: 172 mg/dL — ABNORMAL HIGH (ref 0.0–149.0)
VLDL: 34.4 mg/dL (ref 0.0–40.0)

## 2014-06-24 LAB — MICROALBUMIN / CREATININE URINE RATIO
Creatinine,U: 80.5 mg/dL
MICROALB UR: 3 mg/dL — AB (ref 0.0–1.9)
Microalb Creat Ratio: 3.7 mg/g (ref 0.0–30.0)

## 2014-06-24 LAB — BASIC METABOLIC PANEL
BUN: 11 mg/dL (ref 6–23)
CHLORIDE: 98 meq/L (ref 96–112)
CO2: 27 mEq/L (ref 19–32)
Calcium: 9.3 mg/dL (ref 8.4–10.5)
Creatinine, Ser: 0.54 mg/dL (ref 0.40–1.20)
GFR: 125.58 mL/min (ref >60.00)
Glucose, Bld: 329 mg/dL — ABNORMAL HIGH (ref 70–99)
POTASSIUM: 4.4 meq/L (ref 3.5–5.1)
SODIUM: 135 meq/L (ref 135–145)

## 2014-06-24 LAB — HEMOGLOBIN A1C: Hgb A1c MFr Bld: 12.9 % — ABNORMAL HIGH (ref 4.6–6.5)

## 2014-06-24 LAB — TSH: TSH: 2.57 u[IU]/mL (ref 0.35–4.50)

## 2014-06-24 MED ORDER — INSULIN PEN NEEDLE 31G X 8 MM MISC: Status: DC

## 2014-06-24 MED ORDER — VENLAFAXINE HCL 50 MG PO TABS: 50.0000 mg | ORAL_TABLET | Freq: Three times a day (TID) | ORAL | Status: DC

## 2014-06-24 MED ORDER — GLUCOSE BLOOD VI STRP: ORAL_STRIP | Status: DC

## 2014-06-24 MED ORDER — LEVOTHYROXINE SODIUM 200 MCG PO TABS: ORAL_TABLET | ORAL | Status: DC

## 2014-06-24 MED ORDER — ROSUVASTATIN CALCIUM 20 MG PO TABS: ORAL_TABLET | ORAL | Status: DC

## 2014-06-24 MED ORDER — SAXAGLIPTIN HCL 5 MG PO TABS: 5.0000 mg | ORAL_TABLET | Freq: Every day | ORAL | Status: DC

## 2014-06-24 MED ORDER — LISINOPRIL 5 MG PO TABS: ORAL_TABLET | ORAL | Status: DC

## 2014-06-24 MED ORDER — METFORMIN HCL 1000 MG PO TABS: ORAL_TABLET | ORAL | Status: DC

## 2014-06-24 NOTE — Assessment & Plan Note (Signed)
Stable on effexor, continue same. 

## 2014-06-24 NOTE — Patient Instructions (Addendum)
Check your sugars at least once daily. Start onglyza, stop drinking sweet tea, and try to get 30 minutes of walking 5 days a week.  You will be contacted about your referral to endocrinology. Follow up with Dr. Charlett Blake in 3 months.   Diabetes Mellitus and Food It is important for you to manage your blood sugar (glucose) level. Your blood glucose level can be greatly affected by what you eat. Eating healthier foods in the appropriate amounts throughout the day at about the same time each day will help you control your blood glucose level. It can also help slow or prevent worsening of your diabetes mellitus. Healthy eating may even help you improve the level of your blood pressure and reach or maintain a healthy weight.  HOW CAN FOOD AFFECT ME? Carbohydrates Carbohydrates affect your blood glucose level more than any other type of food. Your dietitian will help you determine how many carbohydrates to eat at each meal and teach you how to count carbohydrates. Counting carbohydrates is important to keep your blood glucose at a healthy level, especially if you are using insulin or taking certain medicines for diabetes mellitus. Alcohol Alcohol can cause sudden decreases in blood glucose (hypoglycemia), especially if you use insulin or take certain medicines for diabetes mellitus. Hypoglycemia can be a life-threatening condition. Symptoms of hypoglycemia (sleepiness, dizziness, and disorientation) are similar to symptoms of having too much alcohol.  If your health care provider has given you approval to drink alcohol, do so in moderation and use the following guidelines:  Women should not have more than one drink per day, and men should not have more than two drinks per day. One drink is equal to:  12 oz of beer.  5 oz of wine.  1 oz of hard liquor.  Do not drink on an empty stomach.  Keep yourself hydrated. Have water, diet soda, or unsweetened iced tea.  Regular soda, juice, and other mixers  might contain a lot of carbohydrates and should be counted. WHAT FOODS ARE NOT RECOMMENDED? As you make food choices, it is important to remember that all foods are not the same. Some foods have fewer nutrients per serving than other foods, even though they might have the same number of calories or carbohydrates. It is difficult to get your body what it needs when you eat foods with fewer nutrients. Examples of foods that you should avoid that are high in calories and carbohydrates but low in nutrients include:  Trans fats (most processed foods list trans fats on the Nutrition Facts label).  Regular soda.  Juice.  Candy.  Sweets, such as cake, pie, doughnuts, and cookies.  Fried foods. WHAT FOODS CAN I EAT? Have nutrient-rich foods, which will nourish your body and keep you healthy. The food you should eat also will depend on several factors, including:  The calories you need.  The medicines you take.  Your weight.  Your blood glucose level.  Your blood pressure level.  Your cholesterol level. You also should eat a variety of foods, including:  Protein, such as meat, poultry, fish, tofu, nuts, and seeds (lean animal proteins are best).  Fruits.  Vegetables.  Dairy products, such as milk, cheese, and yogurt (low fat is best).  Breads, grains, pasta, cereal, rice, and beans.  Fats such as olive oil, trans fat-free margarine, canola oil, avocado, and olives. DOES EVERYONE WITH DIABETES MELLITUS HAVE THE SAME MEAL PLAN? Because every person with diabetes mellitus is different, there is not one meal  plan that works for everyone. It is very important that you meet with a dietitian who will help you create a meal plan that is just right for you. Document Released: 01/19/2005 Document Revised: 04/29/2013 Document Reviewed: 03/21/2013 [facility] Patient Information 2015 [place], [place]. This information is not intended to replace advice given to you by your health care provider.  Make sure you discuss any questions you have with your health care provider.

## 2014-06-24 NOTE — Progress Notes (Signed)
Subjective:     Patient ID: Colleen Baldwin, female   DOB: [birth year], 54 y.o.   MRN: [id number]  HPI   Patient presents today for complete physical. She was due for follow up in October of this year but has not been seen since last June.  Immunizations: flu shot up to date, due for Tdap Diet: reports that she is not doing well with diet- drinks a lot of sweet tea. Exercise: no Colonoscopy: 8/13 Pap Smear:  2014 Mammogram: due in June 2016  Diabetes- metformin only. Not using onlyza. Not checking sugars.  Not following dietary recommendations.   Depression- reports that she continues effexor. She reports that effexor is helping her.    Hypothyroid- She continues synthroid, and reports good compliance.  Anemia- she continues iron supplement.   Review of Systems  Constitutional: Negative for unexpected weight change.       Wt Readings from Last 3 Encounters: 06/24/14 : 191 lb 6.4 oz (86.818 kg) 12/12/13 : 194 lb (87.998 kg) 11/10/13 : 194 lb (87.998 kg)   HENT: Negative for hearing loss and rhinorrhea.   Eyes: Negative for visual disturbance.  Respiratory: Negative for cough.   Cardiovascular: Negative for leg swelling.  Gastrointestinal: Positive for constipation. Negative for nausea.       Iron pills cause constipation  Genitourinary: Negative for dysuria and frequency.  Musculoskeletal: Negative for myalgias and arthralgias.  Skin: Negative for rash.  Neurological: Negative for headaches.  Hematological: Negative for adenopathy.  Psychiatric/Behavioral: Negative for dysphoric mood.       Past Medical History  Diagnosis Date  . Hypertension   . Diabetes mellitus     Type II  uncontrolled  . Hypothyroidism     s/p thyroidectomy for goiter  . Hyperlipidemia   . Depression   . Dysplasia of cervix, low grade (CIN 1) 1992    Cryo   . Asthma   . Preventative health care 11/20/2012  . Personal history of colonic polyps 11/20/2012    Follows with [facility] in  [place]  . Anemia 06/10/2013  . TMJ dysfunction 06/10/2013    History   Social History  . Marital Status: Single    Spouse Name: N/A  . Number of Children: 0  . Years of Education: N/A   Occupational History  . INTERIOR DESIGNER    Social History Main Topics  . Smoking status: Never Smoker   . Smokeless tobacco: Never Used  . Alcohol Use: 0.5 oz/week    1 drink(s) per week  . Drug Use: No  . Sexual Activity: No   Other Topics Concern  . Not on file   Social History Narrative    Past Surgical History  Procedure Laterality Date  . Cholecystectomy  05-2004  . Thyroidectomy  09/1994  . Iud removal  07/20/2008    Of embedded paraguard  . Tonsillectomy and adenoidectomy    . Laparoscopic cholecystectomy  2006    Family History  Problem Relation Age of Onset  . Cancer Mother     ?uterine  . Diabetes Father   . Thyroid disease Father   . Stroke Father   . Hypertension Sister   . Diabetes Maternal Grandmother     Allergies  Allergen Reactions  . Shrimp [Shellfish Allergy] Swelling    Current Outpatient Prescriptions on File Prior to Visit  Medication Sig Dispense Refill  . aspirin 81 MG tablet Take 81 mg by mouth daily.      . Aspirin-Acetaminophen-Caffeine (EXCEDRIN PO) Take  by mouth as needed.    . calcium carbonate (OS-CAL) 600 MG TABS Take 600 mg by mouth 2 (two) times daily with meals.      . cetirizine (ZYRTEC) 10 MG tablet Take 1 tablet by mouth 1-2 times a week.     Marland Kitchen ibuprofen (ADVIL,MOTRIN) 200 MG tablet Take 2-3 tablets by mouth up to 3 times a day as needed for pain.     . Iron Combinations (IRON COMPLEX PO) Take 27 mg by mouth daily.    . Multiple Vitamin (DAILY VITAMINS PO) Take by mouth.    . Potassium Gluconate 595 MG TABS Take 2 tablets by mouth daily.     . [DISCONTINUED] Diphenhydramine-APAP, sleep, (EXCEDRIN PM) 38-500 MG TABS Take 1 tablet by mouth at bedtime as needed.       No current facility-administered medications on file prior to  visit.    BP 130/80 mmHg  Pulse 88  Temp(Src) 98.1 F (36.7 C) (Oral)  Resp 16  Ht 5' 3.5" (1.613 m)  Wt 191 lb 6.4 oz (86.818 kg)  BMI 33.37 kg/m2  SpO2 99%    Objective:   Physical Exam Physical Exam  Constitutional:  Overweight white female. She is oriented to person, place, and time. She appears well-developed and well-nourished. No distress.  HENT:  Head: Normocephalic and atraumatic.  Right Ear: Tympanic membrane and ear canal normal.  Left Ear: Tympanic membrane and ear canal normal.  Mouth/Throat: Oropharynx is clear and moist.  Eyes: No scleral icterus.  Neck: Normal range of motion. No thyromegaly present.  Cardiovascular: Normal rate and regular rhythm.   No murmur heard. Pulmonary/Chest: Effort normal and breath sounds normal. No respiratory distress. He has no wheezes. She has no rales. She exhibits no tenderness.  Abdominal: Soft. Bowel sounds are normal. He exhibits no distension and no mass. There is no tenderness. There is no rebound and no guarding.  Musculoskeletal: She exhibits no edema.  Lymphadenopathy:    She has no cervical adenopathy.  Neurological: She is alert and oriented to person, place, and time. She has normal patellar reflexes. She exhibits normal muscle tone. Coordination normal.  Skin: Skin is warm and dry.  Psychiatric: She has a normal mood and affect. Her behavior is normal. Judgment and thought content normal.  Breasts: Examined lying Right: Without masses, retractions, discharge or axillary adenopathy.  Left: Without masses, retractions, discharge or axillary adenopathy.  Pelvic: deferred         Assessment & Plan:       Assessment:         Plan:

## 2014-06-24 NOTE — Assessment & Plan Note (Addendum)
Reinforced importance of diet and exercise. Tdap today. Obtain routine labs. EKG read as atrial flutter- reviewed EKG with Dr. Mare Ferrari (cardiology) and he reports that EKG is in fact NSR, not flutter.

## 2014-06-24 NOTE — Assessment & Plan Note (Signed)
Reports + compliance with synthroid, obtain follow up tsh.

## 2014-06-24 NOTE — Assessment & Plan Note (Signed)
Non compliant with diet, or med recommendations. Not checking sugars, advised pt to check sugars at least once daily. Obtain follow up a1c, urine microalbumin.  Refer to endocrinology.

## 2014-06-24 NOTE — Assessment & Plan Note (Signed)
Continue iron, obtain cbc to assess.

## 2014-06-25 ENCOUNTER — Telehealth: Payer: Self-pay | Admitting: Family

## 2014-06-25 NOTE — Telephone Encounter (Signed)
Diabetes remains severely uncontrolled and has worsened.  A1C is 12.9 and goal is <7.  I recommend that she start onglyza, continue metformin, work on dietary changes we talked about at her visit as well as exercise.  Follow through with endocrinology referral that we made yesterday. Kidneys are starting to show damage from the diabetes as well- important that she get DM under control.  Keep upcoming appointment with Dr. Charlett Blake in 3 months.

## 2014-06-26 NOTE — Telephone Encounter (Signed)
Left message for pt to return my call.

## 2014-06-29 NOTE — Telephone Encounter (Signed)
Left message for pt to return my call.

## 2014-07-02 NOTE — Telephone Encounter (Signed)
Left message for pt to return my call and mailed letter.

## 2014-07-07 NOTE — Telephone Encounter (Signed)
Pt called and was notified of below results. Pt states she has already started onglyza and making dietary changes and voices understanding of below instructions/recommendations.

## 2014-08-26 ENCOUNTER — Telehealth: Payer: Self-pay | Admitting: *Deleted

## 2014-08-26 MED ORDER — LISINOPRIL 5 MG PO TABS: ORAL_TABLET | ORAL | Status: DC

## 2014-08-26 NOTE — Telephone Encounter (Signed)
Received refill request from [facility] for lisinopril 5mg , 1 tablet daily, #90. Pt has f/u with PCP 10/06/14. Refill sent.

## 2014-09-30 ENCOUNTER — Telehealth: Payer: Self-pay | Admitting: *Deleted

## 2014-09-30 MED ORDER — VENLAFAXINE HCL 50 MG PO TABS: 50.0000 mg | ORAL_TABLET | Freq: Three times a day (TID) | ORAL | Status: DC

## 2014-09-30 NOTE — Telephone Encounter (Signed)
Received request from [facility] for venlafaxine 50mg  1 tablet three times daily. Pt has f/u with PCP on 10/06/14.  Refill sent.

## 2014-10-06 ENCOUNTER — Ambulatory Visit: Payer: Managed Care, Other (non HMO) | Admitting: Family Medicine

## 2014-10-27 ENCOUNTER — Ambulatory Visit (INDEPENDENT_AMBULATORY_CARE_PROVIDER_SITE_OTHER): Payer: Managed Care, Other (non HMO) | Admitting: Family Medicine

## 2014-10-27 ENCOUNTER — Encounter: Payer: Self-pay | Admitting: Family Medicine

## 2014-10-27 VITALS — BP 136/74 | HR 103 | Temp 98.2°F | Ht 63.5 in | Wt 189.2 lb

## 2014-10-27 DIAGNOSIS — E1165 Type 2 diabetes mellitus with hyperglycemia: Secondary | ICD-10-CM

## 2014-10-27 DIAGNOSIS — E890 Postprocedural hypothyroidism: Secondary | ICD-10-CM | POA: Diagnosis not present

## 2014-10-27 DIAGNOSIS — E782 Mixed hyperlipidemia: Secondary | ICD-10-CM

## 2014-10-27 DIAGNOSIS — IMO0002 Reserved for concepts with insufficient information to code with codable children: Secondary | ICD-10-CM

## 2014-10-27 DIAGNOSIS — I10 Essential (primary) hypertension: Secondary | ICD-10-CM

## 2014-10-27 MED ORDER — SAXAGLIPTIN HCL 5 MG PO TABS: 5.0000 mg | ORAL_TABLET | Freq: Every day | ORAL | Status: DC

## 2014-10-27 MED ORDER — LISINOPRIL 5 MG PO TABS: ORAL_TABLET | ORAL | Status: DC

## 2014-10-27 MED ORDER — VENLAFAXINE HCL 50 MG PO TABS: 50.0000 mg | ORAL_TABLET | Freq: Three times a day (TID) | ORAL | Status: DC

## 2014-10-27 MED ORDER — METFORMIN HCL 1000 MG PO TABS: ORAL_TABLET | ORAL | Status: DC

## 2014-10-27 MED ORDER — LEVOTHYROXINE SODIUM 200 MCG PO TABS: ORAL_TABLET | ORAL | Status: DC

## 2014-10-27 MED ORDER — ROSUVASTATIN CALCIUM 20 MG PO TABS: ORAL_TABLET | ORAL | Status: DC

## 2014-10-27 MED ORDER — GLUCOSE BLOOD VI STRP: 1.0000 | ORAL_STRIP | Freq: Two times a day (BID) | Status: DC | PRN

## 2014-10-27 NOTE — Assessment & Plan Note (Signed)
On Levothyroxine, continue to monitor 

## 2014-10-27 NOTE — Progress Notes (Signed)
Pre visit review using our clinic review tool, if applicable. No additional management support is needed unless otherwise documented below in the visit note. 

## 2014-10-27 NOTE — Progress Notes (Signed)
Colleen Baldwin  [id number] [birth year] 10/27/2014      Progress Note-Follow Up  Subjective  Chief Complaint  Chief Complaint  Patient presents with  . Follow-up    3 month    HPI  Patient is a 54 y.o. female in today for routine medical care. Patient is in today for follow-up on numerous concerns. She notes no polyuria or polydipsia and does acknowledge sugars have been improving. No recent illness. Depression is well controlled on current meds. Denies CP/palp/SOB/HA/congestion/fevers/GI or GU c/o. Taking meds as prescribed  Past Medical History  Diagnosis Date  . Hypertension   . Diabetes mellitus     Type II  uncontrolled  . Hypothyroidism     s/p thyroidectomy for goiter  . Hyperlipidemia   . Depression   . Dysplasia of cervix, low grade (CIN 1) 1992    Cryo   . Asthma   . Preventative health care 11/20/2012  . Personal history of colonic polyps 11/20/2012    Follows with [facility] [place]  . Anemia 06/10/2013  . TMJ dysfunction 06/10/2013    Past Surgical History  Procedure Laterality Date  . Cholecystectomy  05-2004  . Thyroidectomy  09/1994  . Iud removal  07/20/2008    Of embedded paraguard  . Tonsillectomy and adenoidectomy    . Laparoscopic cholecystectomy  2006    Family History  Problem Relation Age of Onset  . Cancer Mother     ?uterine  . Diabetes Father   . Thyroid disease Father   . Stroke Father   . Hypertension Sister   . Diabetes Maternal Grandmother     History   Social History  . Marital Status: Single    Spouse Name: N/A  . Number of Children: 0  . Years of Education: N/A   Occupational History  . INTERIOR DESIGNER    Social History Main Topics  . Smoking status: Never Smoker   . Smokeless tobacco: Never Used  . Alcohol Use: 0.5 oz/week    1 drink(s) per week  . Drug Use: No  . Sexual Activity: No   Other Topics Concern  . Not on file   Social History Narrative    Current Outpatient Prescriptions on  File Prior to Visit  Medication Sig Dispense Refill  . aspirin 81 MG tablet Take 81 mg by mouth daily.      . Aspirin-Acetaminophen-Caffeine (EXCEDRIN PO) Take by mouth as needed.    . Biotin 10 MG TABS Take 1 tablet by mouth every morning.    . calcium carbonate (OS-CAL) 600 MG TABS Take 600 mg by mouth 2 (two) times daily with meals.      . cetirizine (ZYRTEC) 10 MG tablet Take 1 tablet by mouth 1-2 times a week.     . ferrous sulfate 325 (65 FE) MG tablet Take 325 mg by mouth every evening.    Marland Kitchen glucose blood (ACCU-CHEK AVIVA) test strip Check BS bid and prn.  DX: 250.02 100 each 3  . ibuprofen (ADVIL,MOTRIN) 200 MG tablet Take 2-3 tablets by mouth up to 3 times a day as needed for pain.     . Insulin Pen Needle (B-D ULTRAFINE III SHORT PEN) 31G X 8 MM MISC As directed. 100 each 3  . Iron Combinations (IRON COMPLEX PO) Take 27 mg by mouth daily.    . Multiple Vitamin (DAILY VITAMINS PO) Take by mouth.    . Potassium Gluconate 595 MG TABS Take 2 tablets by mouth daily.     . [  DISCONTINUED] Diphenhydramine-APAP, sleep, (EXCEDRIN PM) 38-500 MG TABS Take 1 tablet by mouth at bedtime as needed.       No current facility-administered medications on file prior to visit.    Allergies  Allergen Reactions  . Shrimp [Shellfish Allergy] Swelling    Review of Systems  Review of Systems  Constitutional: Negative for fever, chills and malaise/fatigue.  HENT: Negative for congestion, hearing loss and nosebleeds.   Eyes: Negative for discharge.  Respiratory: Negative for cough, sputum production, shortness of breath and wheezing.   Cardiovascular: Negative for chest pain, palpitations and leg swelling.  Gastrointestinal: Negative for heartburn, nausea, vomiting, abdominal pain, diarrhea, constipation and blood in stool.  Genitourinary: Negative for dysuria, urgency, frequency and hematuria.  Musculoskeletal: Negative for myalgias, back pain and falls.  Skin: Negative for rash.  Neurological:  Negative for dizziness, tremors, sensory change, focal weakness, loss of consciousness, weakness and headaches.  Endo/Heme/Allergies: Negative for polydipsia. Does not bruise/bleed easily.  Psychiatric/Behavioral: Negative for depression and suicidal ideas. The patient is not nervous/anxious and does not have insomnia.     Objective  BP 136/74 mmHg  Pulse 103  Temp(Src) 98.2 F (36.8 C) (Oral)  Ht 5' 3.5" (1.613 m)  Wt 189 lb 4 oz (85.843 kg)  BMI 32.99 kg/m2  SpO2 99%  Physical Exam  Physical Exam  Constitutional: She is oriented to person, place, and time and well-developed, well-nourished, and in no distress. No distress.  HENT:  Head: Normocephalic and atraumatic.  Right Ear: External ear normal.  Left Ear: External ear normal.  Nose: Nose normal.  Mouth/Throat: Oropharynx is clear and moist. No oropharyngeal exudate.  Eyes: Conjunctivae are normal. Pupils are equal, round, and reactive to light. Right eye exhibits no discharge. Left eye exhibits no discharge. No scleral icterus.  Neck: Normal range of motion. Neck supple. No thyromegaly present.  Cardiovascular: Normal rate, regular rhythm, normal heart sounds and intact distal pulses.   No murmur heard. Pulmonary/Chest: Effort normal and breath sounds normal. No respiratory distress. She has no wheezes. She has no rales.  Abdominal: Soft. Bowel sounds are normal. She exhibits no distension and no mass. There is no tenderness.  Musculoskeletal: Normal range of motion. She exhibits no edema or tenderness.  Lymphadenopathy:    She has no cervical adenopathy.  Neurological: She is alert and oriented to person, place, and time. She has normal reflexes. No cranial nerve deficit. Coordination normal.  Skin: Skin is warm and dry. No rash noted. She is not diaphoretic.  Psychiatric: Mood, memory and affect normal.    Lab Results  Component Value Date   TSH 2.57 06/24/2014   Lab Results  Component Value Date   WBC 9.4  06/24/2014   HGB 14.8 06/24/2014   HCT 43.4 06/24/2014   MCV 80.7 06/24/2014   PLT 293.0 06/24/2014   Lab Results  Component Value Date   CREATININE 0.54 06/24/2014   BUN 11 06/24/2014   NA 135 06/24/2014   K 4.4 06/24/2014   CL 98 06/24/2014   CO2 27 06/24/2014   Lab Results  Component Value Date   ALT 25 06/24/2014   AST 22 06/24/2014   ALKPHOS 115 06/24/2014   BILITOT 0.5 06/24/2014   Lab Results  Component Value Date   CHOL 145 06/24/2014   Lab Results  Component Value Date   HDL 44.90 06/24/2014   Lab Results  Component Value Date   LDLCALC 66 06/24/2014   Lab Results  Component Value Date  TRIG 172.0* 06/24/2014   Lab Results  Component Value Date   CHOLHDL 3 06/24/2014     Assessment & Plan  Essential hypertension Well controlled, no changes to meds. Encouraged heart healthy diet such as the DASH diet and exercise as tolerated.   Hypothyroidism On Levothyroxine, continue to monitor  Hyperlipidemia, mixed Tolerating statin, encouraged heart healthy diet, avoid trans fats, minimize simple carbs and saturated fats. Increase exercise as tolerated  FATTY LIVER DISEASE WNL today

## 2014-10-27 NOTE — Patient Instructions (Addendum)
Probiotic daily such as Digestive Ad[employer]age or Hardin Negus colon health Mucinex daily for 2 weeks. 64 oz of clear fluids daily    Basic Carbohydrate Counting for Diabetes Mellitus Carbohydrate counting is a method for keeping track of the amount of carbohydrates you eat. Eating carbohydrates naturally increases the level of sugar (glucose) in your blood, so it is important for you to know the amount that is okay for you to have in every meal. Carbohydrate counting helps keep the level of glucose in your blood within normal limits. The amount of carbohydrates allowed is different for every person. A dietitian can help you calculate the amount that is right for you. Once you know the amount of carbohydrates you can have, you can count the carbohydrates in the foods you want to eat. Carbohydrates are found in the following foods:  Grains, such as breads and cereals.  Dried beans and soy products.  Starchy vegetables, such as potatoes, peas, and corn.  Fruit and fruit juices.  Milk and yogurt.  Sweets and snack foods, such as cake, cookies, candy, chips, soft drinks, and fruit drinks. CARBOHYDRATE COUNTING There are two ways to count the carbohydrates in your food. You can use either of the methods or a combination of both. Reading the "Nutrition Facts" on [place] The "Nutrition Facts" is an area that is included on the labels of almost all packaged food and beverages in the [place]. It includes the serving size of that food or beverage and information about the nutrients in each serving of the food, including the grams (g) of carbohydrate per serving.  Decide the number of servings of this food or beverage that you will be able to eat or drink. Multiply that number of servings by the number of grams of carbohydrate that is listed on the label for that serving. The total will be the amount of carbohydrates you will be having when you eat or drink this food or beverage. Learning  Standard Serving Sizes of Food When you eat food that is not packaged or does not include "Nutrition Facts" on the label, you need to measure the servings in order to count the amount of carbohydrates.A serving of most carbohydrate-rich foods contains about 15 g of carbohydrates. The following list includes serving sizes of carbohydrate-rich foods that provide 15 g ofcarbohydrate per serving:   1 slice of bread (1 oz) or 1 six-inch tortilla.    of a hamburger bun or English muffin.  4-6 crackers.   cup unsweetened dry cereal.    cup hot cereal.   cup rice or pasta.    cup mashed potatoes or  of a large baked potato.  1 cup fresh fruit or one small piece of fruit.    cup canned or frozen fruit or fruit juice.  1 cup milk.   cup plain fat-free yogurt or yogurt sweetened with artificial sweeteners.   cup cooked dried beans or starchy vegetable, such as peas, corn, or potatoes.  Decide the number of standard-size servings that you will eat. Multiply that number of servings by 15 (the grams of carbohydrates in that serving). For example, if you eat 2 cups of strawberries, you will have eaten 2 servings and 30 g of carbohydrates (2 servings x 15 g = 30 g). For foods such as soups and casseroles, in which more than one food is mixed in, you will need to count the carbohydrates in each food that is included. EXAMPLE OF CARBOHYDRATE COUNTING Sample Dinner  3  oz chicken breast.   cup of brown rice.   cup of corn.  1 cup milk.   1 cup strawberries with sugar-free whipped topping.  Carbohydrate Calculation Step 1: Identify the foods that contain carbohydrates:   Rice.   Corn.   Milk.   Strawberries. Step 2:Calculate the number of servings eaten of each:   2 servings of rice.   1 serving of corn.   1 serving of milk.   1 serving of strawberries. Step 3: Multiply each of those number of servings by 15 g:   2 servings of rice x 15 g = 30 g.    1 serving of corn x 15 g = 15 g.   1 serving of milk x 15 g = 15 g.   1 serving of strawberries x 15 g = 15 g. Step 4: Add together all of the amounts to find the total grams of carbohydrates eaten: 30 g + 15 g + 15 g + 15 g = 75 g. Document Released: 04/24/2005 Document Revised: 09/08/2013 Document Reviewed: 03/21/2013 [facility] Patient Information 2015 [place], [place]. This information is not intended to replace advice given to you by your health care provider. Make sure you discuss any questions you have with your health care provider.

## 2014-10-27 NOTE — Assessment & Plan Note (Signed)
Well controlled, no changes to meds. Encouraged heart healthy diet such as the DASH diet and exercise as tolerated.  °

## 2014-10-27 NOTE — Assessment & Plan Note (Signed)
Tolerating statin, encouraged heart healthy diet, avoid trans fats, minimize simple carbs and saturated fats. Increase exercise as tolerated 

## 2014-10-28 ENCOUNTER — Other Ambulatory Visit (INDEPENDENT_AMBULATORY_CARE_PROVIDER_SITE_OTHER): Payer: Managed Care, Other (non HMO)

## 2014-10-28 DIAGNOSIS — I10 Essential (primary) hypertension: Secondary | ICD-10-CM | POA: Diagnosis not present

## 2014-10-28 DIAGNOSIS — E1165 Type 2 diabetes mellitus with hyperglycemia: Secondary | ICD-10-CM | POA: Diagnosis not present

## 2014-10-28 DIAGNOSIS — E782 Mixed hyperlipidemia: Secondary | ICD-10-CM | POA: Diagnosis not present

## 2014-10-28 DIAGNOSIS — IMO0002 Reserved for concepts with insufficient information to code with codable children: Secondary | ICD-10-CM

## 2014-10-28 DIAGNOSIS — E890 Postprocedural hypothyroidism: Secondary | ICD-10-CM

## 2014-10-28 LAB — COMPREHENSIVE METABOLIC PANEL
ALT: 25 U/L (ref 0–35)
AST: 26 U/L (ref 0–37)
Albumin: 3.9 g/dL (ref 3.5–5.2)
Alkaline Phosphatase: 107 U/L (ref 39–117)
BILIRUBIN TOTAL: 0.3 mg/dL (ref 0.2–1.2)
BUN: 12 mg/dL (ref 6–23)
CO2: 27 mEq/L (ref 19–32)
CREATININE: 0.56 mg/dL (ref 0.40–1.20)
Calcium: 8.9 mg/dL (ref 8.4–10.5)
Chloride: 101 mEq/L (ref 96–112)
GFR: 120.26 mL/min (ref >60.00)
Glucose, Bld: 286 mg/dL — ABNORMAL HIGH (ref 70–99)
Potassium: 4.1 mEq/L (ref 3.5–5.1)
Sodium: 137 mEq/L (ref 135–145)
Total Protein: 6.9 g/dL (ref 6.0–8.3)

## 2014-10-28 LAB — LIPID PANEL
CHOLESTEROL: 126 mg/dL (ref 0–200)
HDL: 36.2 mg/dL — AB (ref >39.00)
LDL Cholesterol: 54 mg/dL (ref 0–99)
NonHDL: 89.8
TRIGLYCERIDES: 181 mg/dL — AB (ref 0.0–149.0)
Total CHOL/HDL Ratio: 3
VLDL: 36.2 mg/dL (ref 0.0–40.0)

## 2014-10-28 LAB — CBC
HEMATOCRIT: 37.7 % (ref 36.0–46.0)
Hemoglobin: 12.6 g/dL (ref 12.0–15.0)
MCHC: 33.5 g/dL (ref 30.0–36.0)
MCV: 84.4 fl (ref 78.0–100.0)
PLATELETS: 325 10*3/uL (ref 150.0–400.0)
RBC: 4.47 Mil/uL (ref 3.87–5.11)
RDW: 13.9 % (ref 11.5–15.5)
WBC: 8.9 10*3/uL (ref 4.0–10.5)

## 2014-10-28 LAB — HEMOGLOBIN A1C: HEMOGLOBIN A1C: 10.6 % — AB (ref 4.6–6.5)

## 2014-10-28 LAB — TSH: TSH: 1.85 u[IU]/mL (ref 0.35–4.50)

## 2014-10-29 ENCOUNTER — Other Ambulatory Visit: Payer: Self-pay | Admitting: Family Medicine

## 2014-10-29 MED ORDER — DULAGLUTIDE 0.75 MG/0.5ML ~~LOC~~ SOAJ: 0.7500 mg | SUBCUTANEOUS | Status: DC

## 2014-11-09 NOTE — Assessment & Plan Note (Signed)
WNL today 

## 2014-11-18 ENCOUNTER — Ambulatory Visit: Payer: 59 | Admitting: Gynecology

## 2014-11-20 ENCOUNTER — Other Ambulatory Visit: Payer: Self-pay | Admitting: Nurse Practitioner

## 2014-11-20 DIAGNOSIS — R921 Mammographic calcification found on diagnostic imaging of breast: Secondary | ICD-10-CM

## 2014-11-24 ENCOUNTER — Encounter: Payer: Self-pay | Admitting: Nurse Practitioner

## 2014-11-24 ENCOUNTER — Ambulatory Visit (INDEPENDENT_AMBULATORY_CARE_PROVIDER_SITE_OTHER): Payer: Managed Care, Other (non HMO) | Admitting: Nurse Practitioner

## 2014-11-24 VITALS — BP 132/74 | HR 64 | Ht 63.5 in | Wt 188.0 lb

## 2014-11-24 DIAGNOSIS — N951 Menopausal and female climacteric states: Secondary | ICD-10-CM | POA: Diagnosis not present

## 2014-11-24 DIAGNOSIS — Z01419 Encounter for gynecological examination (general) (routine) without abnormal findings: Secondary | ICD-10-CM

## 2014-11-24 DIAGNOSIS — Z113 Encounter for screening for infections with a predominantly sexual mode of transmission: Secondary | ICD-10-CM

## 2014-11-24 NOTE — Patient Instructions (Signed)

## 2014-11-24 NOTE — Progress Notes (Signed)
Patient ID: Colleen Baldwin, female   DOB: [birth year], 54 y.o.   MRN: [id number] 54 y.o. G1P0010 legally separated Caucasian Fe here for annual exam.  She is doing well with rare vaso symptoms  She has a new partner of 3 months.  Last Hgb AIC 10.6 which was an improvement.  She feels well in general.  Patient's last menstrual period was 05/09/2007 (approximate).          Sexually active: Yes.    The current method of family planning is post menopausal status.    Exercising: No.  The patient does not participate in regular exercise at present. Smoker:  no  Health Maintenance: Pap:  11/06/12, negative with neg HR HPV MMG:  10/22/13 with diagnostic right 05/22/14; Bi-Rads 3:  Probably benign; 05/22/14 diagnostic right, Bi-Rads 3:  Probably benign; pt has Bilateral Diagnostic MMG scheduled for 12/01/14. Colonoscopy:  12/01/11, polyps, repeat in 3 years TDaP:  06/24/14 Labs:  PCP in EPIC   reports that she has never smoked. She has never used smokeless tobacco. She reports that she drinks about 0.5 oz of alcohol per week. She reports that she does not use illicit drugs.  Past Medical History  Diagnosis Date  . Hypertension   . Diabetes mellitus     Type II  uncontrolled  . Hypothyroidism     s/p thyroidectomy for goiter  . Hyperlipidemia   . Depression   . Dysplasia of cervix, low grade (CIN 1) 1992    Cryo   . Asthma   . Preventative health care 11/20/2012  . Personal history of colonic polyps 11/20/2012    Follows with [facility] in [place]  . Anemia 06/10/2013  . TMJ dysfunction 06/10/2013    Past Surgical History  Procedure Laterality Date  . Cholecystectomy  05-2004  . Thyroidectomy  09/1994  . Iud removal  07/20/2008    Of embedded paraguard  . Tonsillectomy and adenoidectomy    . Laparoscopic cholecystectomy  2006    Current Outpatient Prescriptions  Medication Sig Dispense Refill  . aspirin 81 MG tablet Take 81 mg by mouth daily.      . Aspirin-Acetaminophen-Caffeine  (EXCEDRIN PO) Take by mouth as needed.    . Biotin 10 MG TABS Take 1 tablet by mouth every morning.    . calcium carbonate (OS-CAL) 600 MG TABS Take 600 mg by mouth 2 (two) times daily with meals.      . cetirizine (ZYRTEC) 10 MG tablet Take 1 tablet by mouth 1-2 times a week.     . Dulaglutide (TRULICITY) 2.67 TI/4.5YK SOPN Inject 0.75 mg into the skin once a week. 1 pen 3  . ferrous sulfate 325 (65 FE) MG tablet Take 325 mg by mouth every evening.    Marland Kitchen glucose blood (ACCU-CHEK AVIVA) test strip Check BS bid and prn.  DX: 250.02 100 each 3  . glucose blood test strip 1 each by Other route 2 (two) times daily as needed for other. Use as instructed 100 each 12  . ibuprofen (ADVIL,MOTRIN) 200 MG tablet Take 2-3 tablets by mouth up to 3 times a day as needed for pain.     . Insulin Pen Needle (B-D ULTRAFINE III SHORT PEN) 31G X 8 MM MISC As directed. 100 each 3  . Iron Combinations (IRON COMPLEX PO) Take 27 mg by mouth daily.    Marland Kitchen levothyroxine (SYNTHROID, LEVOTHROID) 200 MCG tablet TAKE ONE TABLET BY MOUTH ONCE DAILY BEFORE BREAKFAST. 90 tablet 2  .  lisinopril (PRINIVIL,ZESTRIL) 5 MG tablet TAKE ONE TABLET BY MOUTH EVERY DAY 90 tablet 2  . metFORMIN (GLUCOPHAGE) 1000 MG tablet TAKE ONE TABLET BY MOUTH TWICE DAILY WITH MEALS. 180 tablet 2  . Multiple Vitamin (DAILY VITAMINS PO) Take by mouth.    . Potassium Gluconate 595 MG TABS Take 2 tablets by mouth daily.     . rosuvastatin (CRESTOR) 20 MG tablet Take 1 tablet by mouth  daily 90 tablet 2  . saxagliptin HCl (ONGLYZA) 5 MG TABS tablet Take 1 tablet (5 mg total) by mouth daily. 90 tablet 2  . venlafaxine (EFFEXOR) 50 MG tablet Take 1 tablet (50 mg total) by mouth 3 (three) times daily with meals. 270 tablet 2  . [DISCONTINUED] Diphenhydramine-APAP, sleep, (EXCEDRIN PM) 38-500 MG TABS Take 1 tablet by mouth at bedtime as needed.       No current facility-administered medications for this visit.    Family History  Problem Relation Age of Onset   . Cancer Mother     ?uterine  . Diabetes Father   . Thyroid disease Father   . Stroke Father   . Hypertension Sister   . Heart failure Maternal Grandfather   . Diabetes Paternal Grandmother   . Heart failure Paternal Grandfather     ROS:  Pertinent items are noted in HPI.  Otherwise, a comprehensive ROS was negative.  Exam:   BP 132/74 mmHg  Pulse 64  Ht 5' 3.5" (1.613 m)  Wt 188 lb (85.276 kg)  BMI 32.78 kg/m2  LMP 05/09/2007 (Approximate) Height: 5' 3.5" (161.3 cm) Ht Readings from Last 3 Encounters:  11/24/14 5' 3.5" (1.613 m)  10/27/14 5' 3.5" (1.613 m)  06/24/14 5' 3.5" (1.613 m)    General appearance: alert, cooperative and appears stated age Head: Normocephalic, without obvious abnormality, atraumatic Neck: no adenopathy, supple, symmetrical, trachea midline and thyroid normal to inspection and palpation Lungs: clear to auscultation bilaterally Breasts: normal appearance, no masses or tenderness Heart: regular rate and rhythm Abdomen: soft, non-tender; no masses,  no organomegaly Extremities: extremities normal, atraumatic, no cyanosis or edema Skin: Skin color, texture, turgor normal. No rashes or lesions Lymph nodes: Cervical, supraclavicular, and axillary nodes normal. No abnormal inguinal nodes palpated Neurologic: Grossly normal   Pelvic: External genitalia:  no lesions              Urethra:  normal appearing urethra with no masses, tenderness or lesions              Bartholin's and Skene's: normal                 Vagina: normal appearing vagina with normal color and discharge, no lesions              Cervix: anteverted              Pap taken: No. Bimanual Exam:  Uterus:  normal size, contour, position, consistency, mobility, non-tender              Adnexa: no mass, fullness, tenderness               Rectovaginal: Confirms               Anus:  normal sphincter tone, no lesions  Chaperone present:  yes  A:  Well Woman with normal exam  Postmenopausal  no HRT  History of DM - poor control; hypothyroid  R/O STD's  History of CIN I with Cryo 1992   P:  Reviewed health and wellness pertinent to exam  Pap smear as above  Mammogram is due in July and scheduled for 7/26  Follow with STD's  Counseled on breast self exam, mammography screening, adequate intake of calcium and vitamin D, diet and exercise return annually or prn  An After Visit Summary was printed and given to the patient.

## 2014-11-25 LAB — FOLLICLE STIMULATING HORMONE: FSH: 20 m[IU]/mL

## 2014-11-25 LAB — STD PANEL
HIV 1&2 Ab, 4th Generation: NONREACTIVE
Hepatitis B Surface Ag: NEGATIVE

## 2014-11-25 NOTE — Progress Notes (Signed)
Encounter reviewed by Dr. Selmer Adduci Amundson C. Silva.  

## 2014-11-26 LAB — IPS N GONORRHOEA AND CHLAMYDIA BY PCR

## 2014-12-01 ENCOUNTER — Ambulatory Visit
Admission: RE | Admit: 2014-12-01 | Discharge: 2014-12-01 | Disposition: A | Discharge status: Home / Self Care | Payer: Managed Care, Other (non HMO) | Source: Ambulatory Visit | Attending: Nurse Practitioner | Admitting: Nurse Practitioner

## 2014-12-01 DIAGNOSIS — R921 Mammographic calcification found on diagnostic imaging of breast: Secondary | ICD-10-CM

## 2014-12-07 ENCOUNTER — Other Ambulatory Visit: Payer: Self-pay | Admitting: Nurse Practitioner

## 2014-12-07 ENCOUNTER — Telehealth: Payer: Self-pay | Admitting: Emergency Medicine

## 2014-12-07 DIAGNOSIS — N912 Amenorrhea, unspecified: Secondary | ICD-10-CM

## 2014-12-07 NOTE — Telephone Encounter (Signed)
Spoke with patient. She is aware of results and okay to schedule Pelvic ultrasound and Endometrial biopsy with Dr. Quincy Simmonds. Brief descriptions of procedures given.   She is scheduled for 12/17/14 at 0830 with arrival time of 0815.   Orders are placed for Pelvic ultrasound and Endometrial biopsy with Dr. Quincy Simmonds.   Patient is advised she will be contacted by insurance to discuss coverage. cc Kerry Hough.  Cc Milford Cage, FNP.  Routing to provider for final review. Patient agreeable to disposition. Will close encounter.

## 2014-12-07 NOTE — Telephone Encounter (Signed)
-----   Message from Kem Boroughs, [place] sent at 12/07/2014 10:35 AM EDT ----- Please see Dr. Elza Rafter recommendation for PUS and EMB (and check Prolactin) to evaluate the endometrium - she has amenorrhea with a normal FSH.  I will place the order in Epic for this. She does have hypothyroid but has been on replacement for some time.

## 2014-12-07 NOTE — Telephone Encounter (Signed)
Notes Recorded by Kem Boroughs, FNP on 12/07/2014 at 10:35 AM Please see Dr. Elza Rafter recommendation for PUS and EMB (and check Prolactin) to evaluate the endometrium - she has amenorrhea with a normal FSH. I will place the order in Epic for this. She does have hypothyroid but has been on replacement for some time. Notes Recorded by Graylon Good, CMA on 11/30/2014 at 9:26 AM Pt notified of STD panel,FSH and GC/Chlam results. Pt voices understanding and is agreeable with plan. Notes Recorded by Nunzio Cobbs, MD on 11/29/2014 at 7:45 PM Please have patient return for a pelvic ultrasound and EMB to evaluate her endometrium. Prolactin can also be checked. Notes Recorded by Regina Eck, CNM on 11/27/2014 at 8:14 AM Notify patient that GC,Chlamydia, HIV,RPR, Hep B are negative [place] does not show menopausal range range is 23 to 116.3

## 2014-12-15 ENCOUNTER — Telehealth: Payer: Self-pay | Admitting: Obstetrics and Gynecology

## 2014-12-15 NOTE — Telephone Encounter (Signed)
Spoke with patient and reviewed benefits and appointment arrival time. Patient agreeable to all information. Ok to close

## 2014-12-17 ENCOUNTER — Encounter: Payer: Self-pay | Admitting: Obstetrics and Gynecology

## 2014-12-17 ENCOUNTER — Ambulatory Visit (INDEPENDENT_AMBULATORY_CARE_PROVIDER_SITE_OTHER): Payer: Managed Care, Other (non HMO) | Admitting: Obstetrics and Gynecology

## 2014-12-17 ENCOUNTER — Ambulatory Visit (INDEPENDENT_AMBULATORY_CARE_PROVIDER_SITE_OTHER): Payer: Managed Care, Other (non HMO)

## 2014-12-17 ENCOUNTER — Telehealth: Payer: Self-pay

## 2014-12-17 VITALS — BP 148/80 | HR 88 | Ht 63.5 in | Wt 185.0 lb

## 2014-12-17 DIAGNOSIS — D259 Leiomyoma of uterus, unspecified: Secondary | ICD-10-CM | POA: Diagnosis not present

## 2014-12-17 DIAGNOSIS — N882 Stricture and stenosis of cervix uteri: Secondary | ICD-10-CM

## 2014-12-17 DIAGNOSIS — N912 Amenorrhea, unspecified: Secondary | ICD-10-CM

## 2014-12-17 MED ORDER — MEDROXYPROGESTERONE ACETATE 10 MG PO TABS: 10.0000 mg | ORAL_TABLET | Freq: Every day | ORAL | Status: DC

## 2014-12-17 NOTE — Telephone Encounter (Signed)
-----   Message from Nunzio Cobbs, MD sent at 12/17/2014  3:20 PM EDT ----- Regarding: Needs prolactin level drawn Please contact patient about need for prolactin level to be drawn.   She left the office without having blood work being done.   This was a future order which was placed in the computer.   Thanks,   [employer]

## 2014-12-17 NOTE — Telephone Encounter (Signed)
Spoke with patient. Advised of message as seen below from [place]. Patient is agreeable. Lab appointment scheduled for 12/22/2014 at 2pm. Patient is agreeable to date and time.  Routing to provider for final review. Patient agreeable to disposition. Will close encounter.   Patient aware provider will review message and nurse will return call if any additional advice or change of disposition.

## 2014-12-17 NOTE — Progress Notes (Signed)
Subjective  54 y.o. G50P0010  Caucasian female here for pelvic ultrasound for amenorrhea and FSH 20 on 11/24/14.  LMP was 2010.   Had removal of imbedded Paraguard 07/20/08. Had one menses following that and then it stopped.  Cycles were very regular up to the point of the IUD removal.   Has had occasional spotting in a very small amount.  Has happened twice.  Thinks she may be having hot flashes.   Status post thyroidectomy.   Mother had a gynecologic malignancy.  Uncertain what was the etiology but thinks it may have been cervical.   Objective  Pelvic ultrasound images and report reviewed with patient.  Uterus -  4 fibroids intramural - 0.75 - 1.85 cm  EMS - 2.32 mm Ovaries - left ovary with 14 mm follicle Free fluid - no       Procedure - endometrial biopsy Consent performed. Speculum place in vagina.  Sterile prep of cervix with  Hibiclens. Tenaculum to anterior cervical lip. Paracervical block with 10 cc 1% lidocaine _______________ yes  Lot number [id number], expiration 05/09/15. Usable to pass [place]. Unable to pass os finder.  Cervical stenosis so procedure abandoned. Speculum removed.  No complications. Minimal EBL.  Assessment  Amenorrhea.  [place] not in menopausal range.  Cervical stenosis.  EMB procedure abandoned.  Low risk of hyperplasia or malignancy.  Left ovarian follicles.  Fibroids.  Asymptomatic.   Plan  Discussion of fibroids, perimenopausal status, and cervical stenosis.  EMB will not be pursued further.   Will do Provera 10 mg po x 14 days every 3 months.  Explained rationale, side effects, and expected withdrawal bleeding.  Patient will call to report outcome of using Provera.  Patient will return for a recheck in 3 months.   __25_____ minutes face to face time of which over 50% was spent in counseling.   After visit summary to patient.

## 2014-12-22 ENCOUNTER — Other Ambulatory Visit (INDEPENDENT_AMBULATORY_CARE_PROVIDER_SITE_OTHER): Payer: Managed Care, Other (non HMO)

## 2014-12-22 DIAGNOSIS — N912 Amenorrhea, unspecified: Secondary | ICD-10-CM

## 2014-12-23 LAB — PROLACTIN: Prolactin: 8.3 ng/mL

## 2014-12-25 ENCOUNTER — Telehealth: Payer: Self-pay

## 2014-12-25 NOTE — Telephone Encounter (Signed)
-----   Message from Nunzio Cobbs, MD sent at 12/24/2014  5:51 AM EDT ----- Please report normal prolactin level to patient.  Plan will remain for her to take the cyclic Provera 10 mg po q 14 days every 3 months.  Patient is to call with the result of her Provera challenges. Already has Rx.  [place]

## 2014-12-25 NOTE — Telephone Encounter (Signed)
Left message to call Kaitlyn at [phone]. 

## 2014-12-29 NOTE — Telephone Encounter (Signed)
Spoke with patient. Advised of message as seen below from [place]. Patient is agreeable and verbalizes understanding. Will call with the results of her Provera challenge. Aware it may take 2 weeks after the completion of the last Provera tablet to have any bleeding. Patient will monitor and return call to office with update.  Routing to provider for final review. Patient agreeable to disposition. Will close encounter.   Patient aware provider will review message and nurse will return call if any additional advice or change of disposition.

## 2015-02-23 ENCOUNTER — Telehealth: Payer: Self-pay | Admitting: Family Medicine

## 2015-02-23 ENCOUNTER — Ambulatory Visit: Payer: Managed Care, Other (non HMO) | Admitting: Family Medicine

## 2015-02-24 NOTE — Telephone Encounter (Signed)
Pt was no show 02/23/15 1:15pm, follow up appt, pt has not rescheduled, charge or no charge?

## 2015-02-24 NOTE — Telephone Encounter (Signed)
charge 

## 2015-05-05 ENCOUNTER — Ambulatory Visit (INDEPENDENT_AMBULATORY_CARE_PROVIDER_SITE_OTHER): Payer: BLUE CROSS/BLUE SHIELD | Admitting: Family Medicine

## 2015-05-05 ENCOUNTER — Other Ambulatory Visit: Payer: Self-pay | Admitting: Family Medicine

## 2015-05-05 ENCOUNTER — Encounter: Payer: Self-pay | Admitting: Family Medicine

## 2015-05-05 VITALS — BP 124/78 | HR 100 | Temp 98.0°F | Resp 16 | Ht 64.0 in | Wt 171.5 lb

## 2015-05-05 DIAGNOSIS — N63 Unspecified lump in breast: Secondary | ICD-10-CM | POA: Diagnosis not present

## 2015-05-05 DIAGNOSIS — Z23 Encounter for immunization: Secondary | ICD-10-CM

## 2015-05-05 DIAGNOSIS — N6310 Unspecified lump in the right breast, unspecified quadrant: Secondary | ICD-10-CM

## 2015-05-05 NOTE — Assessment & Plan Note (Signed)
New.  Area is superior to breast on chest wall.  Non-tender, freely mobile.  Pt had diagnostic mammo in July that was WNL.  Will refer pt for [place] to determine nature of mass.  Pt expressed understanding and is in agreement w/ plan.

## 2015-05-05 NOTE — Patient Instructions (Signed)
Follow up w/ Dr Charlett Blake as needed/scheduled We'll notify you of your ultrasound results Call with any questions or concerns Happy New Year!!!

## 2015-05-05 NOTE — Progress Notes (Signed)
Pre visit review using our clinic review tool, if applicable. No additional management support is needed unless otherwise documented below in the visit note. 

## 2015-05-05 NOTE — Progress Notes (Signed)
   Subjective:    Patient ID: Colleen Baldwin, female    DOB: [birth year], 54 y.o.   MRN: [id number]  HPI Breast lump- R sided, above breast.  2 nursing students have told pt that 'it's a cyst' (boyfriend's daughter and friend).  No TTP.  'it's kinda squishy, size of a nickel'.  Somewhat mobile.  UTD on mammo- done in July.  No similar areas on L side.   Review of Systems For ROS see HPI     Objective:   Physical Exam  Constitutional: She appears well-developed and well-nourished. No distress.  HENT:  Head: Normocephalic and atraumatic.  Pulmonary/Chest: Right breast exhibits mass (2.5-3 cm firm but freely mobile area). Right breast exhibits no inverted nipple, no nipple discharge, no skin change and no tenderness. Left breast exhibits no inverted nipple, no mass, no nipple discharge, no skin change and no tenderness.    Vitals reviewed.         Assessment & Plan:

## 2015-05-12 ENCOUNTER — Ambulatory Visit
Admission: RE | Admit: 2015-05-12 | Discharge: 2015-05-12 | Disposition: A | Discharge status: Home / Self Care | Payer: BLUE CROSS/BLUE SHIELD | Source: Ambulatory Visit | Attending: Family Medicine | Admitting: Family Medicine

## 2015-05-12 ENCOUNTER — Telehealth: Payer: Self-pay | Admitting: Obstetrics and Gynecology

## 2015-05-12 ENCOUNTER — Other Ambulatory Visit: Payer: Self-pay | Admitting: Family Medicine

## 2015-05-12 DIAGNOSIS — N6310 Unspecified lump in the right breast, unspecified quadrant: Secondary | ICD-10-CM

## 2015-05-12 NOTE — Telephone Encounter (Signed)
Please have patient reschedule her appointment with me.  I need to understand her treatment and how this is working.  May need repeat blood work as well.

## 2015-05-12 NOTE — Telephone Encounter (Signed)
Left Message To Call Back  

## 2015-05-12 NOTE — Telephone Encounter (Addendum)
Patient called to cancel appointment for tomorrow 05/13/15 (3 month recheck). Patient says she hasn't had any problems or issues, if Dr. Quincy Simmonds thinks it's necessary for her to come in then she will reschedule. [phone]

## 2015-05-12 NOTE — Telephone Encounter (Signed)
Patient returned call and rescheduled for 05/20/15 with Dr. Quincy Simmonds.  Cc: [place] for [employer].

## 2015-05-12 NOTE — Telephone Encounter (Signed)
Thank you for the update.  I have closed the encounter.   Cc- Starla Curl 

## 2015-05-13 ENCOUNTER — Ambulatory Visit: Payer: Managed Care, Other (non HMO) | Admitting: Obstetrics and Gynecology

## 2015-05-20 ENCOUNTER — Encounter: Payer: Self-pay | Admitting: Obstetrics and Gynecology

## 2015-05-20 ENCOUNTER — Ambulatory Visit (INDEPENDENT_AMBULATORY_CARE_PROVIDER_SITE_OTHER): Payer: BLUE CROSS/BLUE SHIELD | Admitting: Obstetrics and Gynecology

## 2015-05-20 VITALS — BP 140/84 | HR 76 | Ht 64.0 in | Wt 175.0 lb

## 2015-05-20 DIAGNOSIS — N951 Menopausal and female climacteric states: Secondary | ICD-10-CM | POA: Diagnosis not present

## 2015-05-20 DIAGNOSIS — N912 Amenorrhea, unspecified: Secondary | ICD-10-CM | POA: Diagnosis not present

## 2015-05-20 NOTE — Progress Notes (Signed)
Patient ID: Colleen Baldwin, female   DOB: Feb 21, 1961, 55 y.o.   MRN: [id number]  GYNECOLOGY  VISIT   HPI: 55 y.o.   Single  Caucasian  female   [id number] with No LMP recorded. Patient is not currently having periods (Reason: Perimenopausal).   here for  Amenorrhea. No bleeding with Provera challenge last summer.  Had removal of imbedded Paraguard 07/20/08. Had one menses following that and then it stopped.  Cycles were very regular up to the point of the IUD removal.   Pelvic ultrasound 12/17/14:  Uterus - 4 fibroids intramural - 0.75 - 1.85 cm  EMS - 2.32 mm Ovaries - left ovary with 14 mm follicle Free fluid - no No EMB possible due to cervical stenosis.  [place] Roc 20 on 11/24/14.  Now has bloating once in a while.  Hot flashes.   Status post thyroidectomy.   GYNECOLOGIC HISTORY: No LMP recorded. Patient is not currently having periods (Reason: Perimenopausal). Contraception: None, abstinence Menopausal hormone therapy: N/A Last mammogram: See EPIC Last pap smear: 11/06/12, negative with neg HR HPV        OB History    Gravida Para Term Preterm AB TAB SAB Ectopic Multiple Living   1 0 0 0 1 1 0 0 0 0          Patient Active Problem List   Diagnosis Date Noted  . Lump of breast, right 05/05/2015  . Numbness and tingling in right hand 10/24/2013  . Sun-damaged skin 10/24/2013  . Anemia 06/10/2013  . TMJ dysfunction 06/10/2013  . Preventative health care 11/20/2012  . Personal history of colonic polyps 11/20/2012  . Wrist pain 12/28/2011  . Anxiety and depression 09/23/2010  . FATTY LIVER DISEASE 05/12/2010  . Essential hypertension 12/06/2009  . TRANSAMINASES, SERUM, ELEVATED 05/13/2009  . Hypothyroidism 05/11/2009  . Diabetes type 2, uncontrolled ([place]) 05/11/2009  . Hyperlipidemia, mixed 05/11/2009    Past Medical History  Diagnosis Date  . Hypertension   . Diabetes mellitus     Type II  uncontrolled  . Hypothyroidism     s/p thyroidectomy for goiter  .  Hyperlipidemia   . Depression   . Dysplasia of cervix, low grade (CIN 1) 1992    [place]   . Asthma   . Preventative health care 11/20/2012  . Personal history of colonic polyps 11/20/2012    Follows with [facility] [place]  . Anemia 06/10/2013  . TMJ dysfunction 06/10/2013    Past Surgical History  Procedure Laterality Date  . Cholecystectomy  05-2004  . Thyroidectomy  09/1994  . Iud removal  07/20/2008    Of embedded paraguard  . Tonsillectomy and adenoidectomy    . Laparoscopic cholecystectomy  2006    Current Outpatient Prescriptions  Medication Sig Dispense Refill  . aspirin 81 MG tablet Take 81 mg by mouth daily.      . Aspirin-Acetaminophen-Caffeine (EXCEDRIN PO) Take by mouth as needed.    . Biotin 10 MG TABS Take 1 tablet by mouth every morning.    . calcium carbonate (OS-CAL) 600 MG TABS Take 600 mg by mouth 2 (two) times daily with meals.      . cetirizine (ZYRTEC) 10 MG tablet Take 1 tablet by mouth 1-2 times a week.     . Dulaglutide (TRULICITY) A999333 0000000 SOPN Inject 0.75 mg into the skin once a week. 1 pen 3  . ferrous sulfate 325 (65 FE) MG tablet Take 325 mg by mouth every evening.    Marland Kitchen  glucose blood (ACCU-CHEK AVIVA) test strip Check BS bid and prn.  DX: 250.02 100 each 3  . glucose blood test strip 1 each by Other route 2 (two) times daily as needed for other. Use as instructed 100 each 12  . ibuprofen (ADVIL,MOTRIN) 200 MG tablet Take 2-3 tablets by mouth up to 3 times a day as needed for pain.     . Insulin Pen Needle (B-D ULTRAFINE III SHORT PEN) 31G X 8 MM MISC As directed. 100 each 3  . Iron Combinations (IRON COMPLEX PO) Take 27 mg by mouth daily.    Marland Kitchen levothyroxine (SYNTHROID, LEVOTHROID) 200 MCG tablet TAKE ONE TABLET BY MOUTH ONCE DAILY BEFORE BREAKFAST. 90 tablet 2  . lisinopril (PRINIVIL,ZESTRIL) 5 MG tablet TAKE ONE TABLET BY MOUTH EVERY DAY 90 tablet 2  . metFORMIN (GLUCOPHAGE) 1000 MG tablet TAKE ONE TABLET BY MOUTH TWICE DAILY WITH  MEALS. 180 tablet 2  . Multiple Vitamin (DAILY VITAMINS PO) Take by mouth.    . Potassium Gluconate 595 MG TABS Take 2 tablets by mouth daily.     . rosuvastatin (CRESTOR) 20 MG tablet Take 1 tablet by mouth  daily 90 tablet 2  . saxagliptin HCl (ONGLYZA) 5 MG TABS tablet Take 1 tablet (5 mg total) by mouth daily. 90 tablet 2  . venlafaxine (EFFEXOR) 50 MG tablet Take 1 tablet (50 mg total) by mouth 3 (three) times daily with meals. 270 tablet 2  . [DISCONTINUED] Diphenhydramine-APAP, sleep, (EXCEDRIN PM) 38-500 MG TABS Take 1 tablet by mouth at bedtime as needed.       No current facility-administered medications for this visit.     ALLERGIES: Shrimp  Family History  Problem Relation Age of Onset  . Cancer Mother     ?uterine  . Diabetes Father   . Thyroid disease Father   . Stroke Father   . Hypertension Sister   . Heart failure Maternal Grandfather   . Diabetes Paternal Grandmother   . Heart failure Paternal Grandfather     Social History   Social History  . Marital Status: Single    Spouse Name: N/A  . Number of Children: 0  . Years of Education: N/A   Occupational History  . INTERIOR DESIGNER    Social History Main Topics  . Smoking status: Never Smoker   . Smokeless tobacco: Never Used  . Alcohol Use: 0.6 oz/week    1 Standard drinks or equivalent per week  . Drug Use: No  . Sexual Activity: No   Other Topics Concern  . Not on file   Social History Narrative    ROS:  Pertinent items are noted in HPI.  PHYSICAL EXAMINATION:    BP 140/84 mmHg  Pulse 76  Ht 5\' 4"  (1.626 m)  Wt 175 lb (79.379 kg)  BMI 30.02 kg/m2    General appearance: alert, cooperative and appears stated age   ASSESSMENT  Amenorrhea.  Hx of cervical stenosis.  Negative Provera challenge.  Menopausal symptoms.  PLAN  Counseled regarding amenorrhea and etiologies.  Will proceed with repeat labs today - FSH, estradiol, prolactin, serum qual hCG. Follow up for annual exams and  prn.    An After Visit Summary was printed and given to the patient.  ___10___ minutes face to face time of which over 50% was spent in counseling.

## 2015-05-21 LAB — HCG, SERUM, QUALITATIVE: PREG SERUM: NEGATIVE

## 2015-05-21 LAB — ESTRADIOL

## 2015-05-21 LAB — FOLLICLE STIMULATING HORMONE: FSH: 47.7 m[IU]/mL

## 2015-05-21 LAB — PROLACTIN: PROLACTIN: 6.1 ng/mL

## 2015-05-24 ENCOUNTER — Telehealth: Payer: Self-pay

## 2015-05-24 NOTE — Telephone Encounter (Signed)
Left message to call Kaitlyn at [phone]. 

## 2015-05-24 NOTE — Telephone Encounter (Signed)
-----   Message from Nunzio Cobbs, MD sent at 05/21/2015  4:49 PM EST ----- Please inform patient that her labs are consistent with menopause.  The prolactin level is normal.  I did have the lab do a blood pregnancy test also....which was negative.  Options to treat the hot flashes are:  Herbal options - soy, black cohost; hormone therapy; continued Effexor (also works to help control the hot flashes). She does not need to change anything if she is doing well overall.   Cc- Marisa Sprinkles

## 2015-05-27 NOTE — Telephone Encounter (Signed)
Spoke with patient. Advised of results as seen below from [place]. Patient verbalizes understanding. She would like to monitor her hot flashes at this time. Will try black cohosh if they worsen. If this does not help to relieve symptoms she will return call to the office for alternatives.  Routing to provider for final review. Patient agreeable to disposition. Will close encounter.

## 2015-05-27 NOTE — Telephone Encounter (Signed)
Left message to call Kaitlyn at [phone]. 

## 2015-06-29 ENCOUNTER — Other Ambulatory Visit: Payer: Self-pay | Admitting: Family Medicine

## 2015-07-08 ENCOUNTER — Telehealth: Payer: Self-pay | Admitting: Family Medicine

## 2015-07-08 NOTE — Telephone Encounter (Signed)
Patient called stating that she received a bill for a No Show on 02/23/2015 with Dr Charlett Blake. States that she had been terminated from her job in September and the telephone number we called to confirm the appt was her previous work. Request that no show fee be waived. Patient states that she was going through a lot and forgot she had the appt. Can this fee be overturned?

## 2015-07-08 NOTE — Telephone Encounter (Signed)
Of course, I am willing to wave the fee.

## 2015-07-09 ENCOUNTER — Other Ambulatory Visit: Payer: Self-pay | Admitting: Nurse Practitioner

## 2015-07-09 DIAGNOSIS — N63 Unspecified lump in unspecified breast: Secondary | ICD-10-CM

## 2015-07-09 NOTE — Telephone Encounter (Signed)
Spoke with pt. Informed her that fee will be waived.    Thanks.

## 2015-07-19 ENCOUNTER — Other Ambulatory Visit: Payer: BLUE CROSS/BLUE SHIELD

## 2015-08-16 ENCOUNTER — Inpatient Hospital Stay: Admission: RE | Admit: 2015-08-16 | Payer: BLUE CROSS/BLUE SHIELD | Source: Ambulatory Visit

## 2015-08-17 ENCOUNTER — Other Ambulatory Visit: Payer: Self-pay | Admitting: Family Medicine

## 2015-08-23 ENCOUNTER — Ambulatory Visit
Admission: RE | Admit: 2015-08-23 | Discharge: 2015-08-23 | Disposition: A | Discharge status: Home / Self Care | Payer: BLUE CROSS/BLUE SHIELD | Source: Ambulatory Visit | Attending: Nurse Practitioner | Admitting: Nurse Practitioner

## 2015-08-23 DIAGNOSIS — N63 Unspecified lump in unspecified breast: Secondary | ICD-10-CM

## 2015-10-08 ENCOUNTER — Other Ambulatory Visit: Payer: Self-pay | Admitting: Family Medicine

## 2015-10-28 ENCOUNTER — Encounter: Payer: Self-pay | Admitting: Family Medicine

## 2015-10-28 ENCOUNTER — Ambulatory Visit (INDEPENDENT_AMBULATORY_CARE_PROVIDER_SITE_OTHER): Payer: BLUE CROSS/BLUE SHIELD | Admitting: Family Medicine

## 2015-10-28 VITALS — BP 120/72 | HR 85 | Temp 97.9°F | Ht 64.0 in | Wt 160.5 lb

## 2015-10-28 DIAGNOSIS — Z23 Encounter for immunization: Secondary | ICD-10-CM

## 2015-10-28 DIAGNOSIS — F419 Anxiety disorder, unspecified: Secondary | ICD-10-CM

## 2015-10-28 DIAGNOSIS — D649 Anemia, unspecified: Secondary | ICD-10-CM

## 2015-10-28 DIAGNOSIS — F329 Major depressive disorder, single episode, unspecified: Secondary | ICD-10-CM

## 2015-10-28 DIAGNOSIS — F32A Depression, unspecified: Secondary | ICD-10-CM

## 2015-10-28 DIAGNOSIS — R7401 Elevation of levels of liver transaminase levels: Secondary | ICD-10-CM

## 2015-10-28 DIAGNOSIS — E890 Postprocedural hypothyroidism: Secondary | ICD-10-CM

## 2015-10-28 DIAGNOSIS — IMO0002 Reserved for concepts with insufficient information to code with codable children: Secondary | ICD-10-CM

## 2015-10-28 DIAGNOSIS — I10 Essential (primary) hypertension: Secondary | ICD-10-CM | POA: Diagnosis not present

## 2015-10-28 DIAGNOSIS — Z0000 Encounter for general adult medical examination without abnormal findings: Secondary | ICD-10-CM

## 2015-10-28 DIAGNOSIS — E1165 Type 2 diabetes mellitus with hyperglycemia: Secondary | ICD-10-CM

## 2015-10-28 DIAGNOSIS — R740 Nonspecific elevation of levels of transaminase and lactic acid dehydrogenase [LDH]: Secondary | ICD-10-CM

## 2015-10-28 DIAGNOSIS — F418 Other specified anxiety disorders: Secondary | ICD-10-CM

## 2015-10-28 DIAGNOSIS — R7402 Elevation of levels of lactic acid dehydrogenase (LDH): Secondary | ICD-10-CM

## 2015-10-28 DIAGNOSIS — E118 Type 2 diabetes mellitus with unspecified complications: Secondary | ICD-10-CM

## 2015-10-28 LAB — COMPREHENSIVE METABOLIC PANEL
ALBUMIN: 4.2 g/dL (ref 3.5–5.2)
ALT: 19 U/L (ref 0–35)
AST: 16 U/L (ref 0–37)
Alkaline Phosphatase: 82 U/L (ref 39–117)
BILIRUBIN TOTAL: 0.6 mg/dL (ref 0.2–1.2)
BUN: 10 mg/dL (ref 6–23)
CALCIUM: 9.1 mg/dL (ref 8.4–10.5)
CHLORIDE: 101 meq/L (ref 96–112)
CO2: 32 mEq/L (ref 19–32)
CREATININE: 0.54 mg/dL (ref 0.40–1.20)
GFR: 124.94 mL/min (ref >60.00)
Glucose, Bld: 259 mg/dL — ABNORMAL HIGH (ref 70–99)
Potassium: 3.9 mEq/L (ref 3.5–5.1)
Sodium: 136 mEq/L (ref 135–145)
Total Protein: 6.8 g/dL (ref 6.0–8.3)

## 2015-10-28 LAB — CBC
HEMATOCRIT: 40.6 % (ref 36.0–46.0)
HEMOGLOBIN: 13.8 g/dL (ref 12.0–15.0)
MCHC: 33.9 g/dL (ref 30.0–36.0)
MCV: 90 fl (ref 78.0–100.0)
PLATELETS: 283 10*3/uL (ref 150.0–400.0)
RBC: 4.51 Mil/uL (ref 3.87–5.11)
RDW: 13.9 % (ref 11.5–15.5)
WBC: 8.9 10*3/uL (ref 4.0–10.5)

## 2015-10-28 LAB — LIPID PANEL
Cholesterol: 163 mg/dL (ref 0–200)
HDL: 49.2 mg/dL (ref >39.00)
LDL Cholesterol: 90 mg/dL (ref 0–99)
NonHDL: 114.07
TRIGLYCERIDES: 122 mg/dL (ref 0.0–149.0)
Total CHOL/HDL Ratio: 3
VLDL: 24.4 mg/dL (ref 0.0–40.0)

## 2015-10-28 LAB — TSH: TSH: 1.83 u[IU]/mL (ref 0.35–4.50)

## 2015-10-28 LAB — HEMOGLOBIN A1C: Hgb A1c MFr Bld: 9.8 % — ABNORMAL HIGH (ref 4.6–6.5)

## 2015-10-28 MED ORDER — ROSUVASTATIN CALCIUM 20 MG PO TABS: 20.0000 mg | ORAL_TABLET | Freq: Every day | ORAL | Status: DC

## 2015-10-28 MED ORDER — LISINOPRIL 5 MG PO TABS: 5.0000 mg | ORAL_TABLET | Freq: Every day | ORAL | Status: DC

## 2015-10-28 MED ORDER — LEVOTHYROXINE SODIUM 200 MCG PO TABS: ORAL_TABLET | ORAL | Status: DC

## 2015-10-28 MED ORDER — INSULIN PEN NEEDLE 31G X 8 MM MISC: Status: DC

## 2015-10-28 MED ORDER — LIRAGLUTIDE 18 MG/3ML ~~LOC~~ SOPN: 6.0000 mg | PEN_INJECTOR | Freq: Every day | SUBCUTANEOUS | Status: DC

## 2015-10-28 MED ORDER — VENLAFAXINE HCL ER 75 MG PO CP24: 75.0000 mg | ORAL_CAPSULE | Freq: Every day | ORAL | Status: DC

## 2015-10-28 NOTE — Progress Notes (Signed)
Pre visit review using our clinic review tool, if applicable. No additional management support is needed unless otherwise documented below in the visit note. 

## 2015-10-28 NOTE — Patient Instructions (Signed)

## 2015-11-02 ENCOUNTER — Telehealth: Payer: Self-pay | Admitting: Family Medicine

## 2015-11-02 DIAGNOSIS — R7402 Elevation of levels of lactic acid dehydrogenase (LDH): Secondary | ICD-10-CM

## 2015-11-02 DIAGNOSIS — E1165 Type 2 diabetes mellitus with hyperglycemia: Secondary | ICD-10-CM

## 2015-11-02 DIAGNOSIS — D649 Anemia, unspecified: Secondary | ICD-10-CM

## 2015-11-02 DIAGNOSIS — E118 Type 2 diabetes mellitus with unspecified complications: Secondary | ICD-10-CM

## 2015-11-02 DIAGNOSIS — E890 Postprocedural hypothyroidism: Secondary | ICD-10-CM

## 2015-11-02 DIAGNOSIS — I10 Essential (primary) hypertension: Secondary | ICD-10-CM

## 2015-11-02 DIAGNOSIS — R740 Nonspecific elevation of levels of transaminase and lactic acid dehydrogenase [LDH]: Secondary | ICD-10-CM

## 2015-11-02 DIAGNOSIS — R7401 Elevation of levels of liver transaminase levels: Secondary | ICD-10-CM

## 2015-11-02 DIAGNOSIS — IMO0002 Reserved for concepts with insufficient information to code with codable children: Secondary | ICD-10-CM

## 2015-11-02 NOTE — Telephone Encounter (Signed)
Called the patient left message to call back 

## 2015-11-02 NOTE — Telephone Encounter (Signed)
Just a FYI on the venlafaxine. Patient was previously on 50 mg tid and increased after her last appt./discussion with PCP to take the 50 mg 4 x's per day.  She states the 200 mg per day seems to be working well to get/keep her feeling better.  But, a new prescription was written at that appt. For venlafaxine 75 mg once a day.  She states that will not work for her and will be less than she currently is taking. She does not need more right now and has #30 remaining, but when out would like to increase the 75 mg to equal 200 mg daily??

## 2015-11-02 NOTE — Telephone Encounter (Signed)
So I remember discussing increasing her Venlafaxine but I actually thought we agreed to switch her to the max of the Venlafaxine XR by putting her on 75 mg tabs and having her take 3 a day (did not write correct sig so would have to adjust if she wants to proceed with that or we can just have her take 4 of the 50 mg short acting Venlafaxine. Clarify with her which she would like to do. One final option is [facility] says there is a venlafaxine ER 225 tab and take one daily but could not pull up in EPIC could check with pharmacy and see if they carry this and if so give a verbal order and I can deal with the EPIC side

## 2015-11-04 NOTE — Telephone Encounter (Signed)
Called the patient left message to call back 

## 2015-11-04 NOTE — Telephone Encounter (Signed)
Called left message to call back 

## 2015-11-05 NOTE — Telephone Encounter (Signed)
Called left message to call back 

## 2015-11-07 NOTE — Assessment & Plan Note (Signed)
hgba1c acceptable, minimize simple carbs. Increase exercise as tolerated. Continue current meds 

## 2015-11-07 NOTE — Assessment & Plan Note (Signed)
Is struggling with mood after being fired last fall. Venlataxine XR 75 mg daily.

## 2015-11-07 NOTE — Progress Notes (Signed)
Patient ID: Colleen Baldwin, female   DOB: May 29, 1960, 55 y.o.   MRN: [id number]   Subjective:    Patient ID: Colleen Baldwin, female    DOB: [birth year], 55 y.o.   MRN: [id number]  Chief Complaint  Patient presents with  . Follow-up    HPI Patient is in today for follow up. She is not taking Onglyza due to cost. No recent illness. No recent hospitalization. Denies CP/palp/SOB/HA/congestion/fevers/GI or GU c/o. Taking meds as prescribed.  Past Medical History  Diagnosis Date  . Hypertension   . Diabetes mellitus     Type II  uncontrolled  . Hypothyroidism     s/p thyroidectomy for goiter  . Hyperlipidemia   . Depression   . Dysplasia of cervix, low grade (CIN 1) 1992    [place]   . Asthma   . Preventative health care 11/20/2012  . Personal history of colonic polyps 11/20/2012    Follows with [facility] [place]  . Anemia 06/10/2013  . TMJ dysfunction 06/10/2013    Past Surgical History  Procedure Laterality Date  . Cholecystectomy  05-2004  . Thyroidectomy  09/1994  . Iud removal  07/20/2008    Of embedded paraguard  . Tonsillectomy and adenoidectomy    . Laparoscopic cholecystectomy  2006    Family History  Problem Relation Age of Onset  . Cancer Mother     ?uterine  . Diabetes Father   . Thyroid disease Father   . Stroke Father   . Hypertension Sister   . Heart failure Maternal Grandfather   . Diabetes Paternal Grandmother   . Heart failure Paternal Grandfather     Social History   Social History  . Marital Status: Single    Spouse Name: N/A  . Number of Children: 0  . Years of Education: N/A   Occupational History  . INTERIOR DESIGNER    Social History Main Topics  . Smoking status: Never Smoker   . Smokeless tobacco: Never Used  . Alcohol Use: 0.6 oz/week    1 Standard drinks or equivalent per week  . Drug Use: No  . Sexual Activity: No   Other Topics Concern  . Not on file   Social History Narrative    Outpatient Prescriptions Prior to  Visit  Medication Sig Dispense Refill  . aspirin 81 MG tablet Take 81 mg by mouth daily.      . Aspirin-Acetaminophen-Caffeine (EXCEDRIN PO) Take by mouth as needed.    . Biotin 10 MG TABS Take 1 tablet by mouth every morning.    . calcium carbonate (OS-CAL) 600 MG TABS Take 600 mg by mouth 2 (two) times daily with meals.      . cetirizine (ZYRTEC) 10 MG tablet Take 1 tablet by mouth 1-2 times a week.     . ferrous sulfate 325 (65 FE) MG tablet Take 325 mg by mouth every evening.    Marland Kitchen glucose blood (ACCU-CHEK AVIVA) test strip Check BS bid and prn.  DX: 250.02 100 each 3  . glucose blood test strip 1 each by Other route 2 (two) times daily as needed for other. Use as instructed 100 each 12  . ibuprofen (ADVIL,MOTRIN) 200 MG tablet Take 2-3 tablets by mouth up to 3 times a day as needed for pain.     . Iron Combinations (IRON COMPLEX PO) Take 27 mg by mouth daily.    . metFORMIN (GLUCOPHAGE) 1000 MG tablet TAKE ONE TABLET BY MOUTH TWICE DAILY WITH MEALS  180 tablet 0  . Multiple Vitamin (DAILY VITAMINS PO) Take by mouth.    . Potassium Gluconate 595 MG TABS Take 2 tablets by mouth daily.     . Dulaglutide (TRULICITY) A999333 0000000 SOPN Inject 0.75 mg into the skin once a week. 1 pen 3  . Insulin Pen Needle (B-D ULTRAFINE III SHORT PEN) 31G X 8 MM MISC As directed. 100 each 3  . levothyroxine (SYNTHROID, LEVOTHROID) 200 MCG tablet TAKE ONE TABLET BY MOUTH ONCE DAILY BEFORE BREAKFAST 90 tablet 0  . lisinopril (PRINIVIL,ZESTRIL) 5 MG tablet TAKE ONE TABLET BY MOUTH ONCE DAILY 90 tablet 0  . rosuvastatin (CRESTOR) 20 MG tablet Take 1 tablet (20 mg total) by mouth daily. 30 tablet 0  . saxagliptin HCl (ONGLYZA) 5 MG TABS tablet Take 1 tablet (5 mg total) by mouth daily. 90 tablet 2  . venlafaxine (EFFEXOR) 50 MG tablet Take 1 tablet (50 mg total) by mouth 3 (three) times daily with meals. 270 tablet 2   No facility-administered medications prior to visit.    Allergies  Allergen Reactions  .  Shrimp [Shellfish Allergy] Swelling    Review of Systems  Constitutional: Negative for fever, chills and malaise/fatigue.  HENT: Negative for congestion and hearing loss.   Eyes: Negative for discharge.  Respiratory: Negative for cough, sputum production and shortness of breath.   Cardiovascular: Negative for chest pain, palpitations and leg swelling.  Gastrointestinal: Negative for heartburn, nausea, vomiting, abdominal pain, diarrhea, constipation and blood in stool.  Genitourinary: Negative for dysuria, urgency, frequency and hematuria.  Musculoskeletal: Negative for myalgias, back pain and falls.  Skin: Negative for rash.  Neurological: Negative for dizziness, sensory change, loss of consciousness, weakness and headaches.  Endo/Heme/Allergies: Negative for environmental allergies. Does not bruise/bleed easily.  Psychiatric/Behavioral: Positive for depression. Negative for suicidal ideas. The patient is nervous/anxious. The patient does not have insomnia.        Objective:    Physical Exam  Constitutional: She is oriented to person, place, and time. She appears well-developed and well-nourished. No distress.  HENT:  Head: Normocephalic and atraumatic.  Right Ear: External ear normal.  Left Ear: External ear normal.  Nose: Nose normal.  Mouth/Throat: Oropharynx is clear and moist.  Eyes: Conjunctivae and EOM are normal. Pupils are equal, round, and reactive to light. Right eye exhibits no discharge. Left eye exhibits no discharge.  Neck: Normal range of motion. Neck supple. No JVD present. No thyromegaly present.  Cardiovascular: Normal rate, regular rhythm, normal heart sounds and intact distal pulses.   No murmur heard. Pulmonary/Chest: Effort normal and breath sounds normal. No respiratory distress. She has no wheezes. She has no rales. She exhibits no tenderness.  Abdominal: Soft. Bowel sounds are normal. She exhibits no distension and no mass. There is no tenderness. There is  no rebound and no guarding.  Musculoskeletal: Normal range of motion. She exhibits no edema or tenderness.  Lymphadenopathy:    She has no cervical adenopathy.  Neurological: She is alert and oriented to person, place, and time. She has normal reflexes. No cranial nerve deficit.  Skin: Skin is warm and dry. No rash noted. She is not diaphoretic. No erythema.  Psychiatric: She has a normal mood and affect. Her behavior is normal. Judgment and thought content normal.  Nursing note and vitals reviewed.   BP 120/72 mmHg  Pulse 85  Temp(Src) 97.9 F (36.6 C) (Oral)  Ht 5\' 4"  (1.626 m)  Wt 160 lb 8 oz (72.802 kg)  BMI 27.54 kg/m2  SpO2 97% Wt Readings from Last 3 Encounters:  10/28/15 160 lb 8 oz (72.802 kg)  05/20/15 175 lb (79.379 kg)  05/05/15 171 lb 8 oz (77.792 kg)     Lab Results  Component Value Date   WBC 8.9 10/28/2015   HGB 13.8 10/28/2015   HCT 40.6 10/28/2015   PLT 283.0 10/28/2015   GLUCOSE 259* 10/28/2015   CHOL 163 10/28/2015   TRIG 122.0 10/28/2015   HDL 49.20 10/28/2015   LDLCALC 90 10/28/2015   ALT 19 10/28/2015   AST 16 10/28/2015   NA 136 10/28/2015   K 3.9 10/28/2015   CL 101 10/28/2015   CREATININE 0.54 10/28/2015   BUN 10 10/28/2015   CO2 32 10/28/2015   TSH 1.83 10/28/2015   HGBA1C 9.8* 10/28/2015   MICROALBUR 3.0* 06/24/2014    Lab Results  Component Value Date   TSH 1.83 10/28/2015   Lab Results  Component Value Date   WBC 8.9 10/28/2015   HGB 13.8 10/28/2015   HCT 40.6 10/28/2015   MCV 90.0 10/28/2015   PLT 283.0 10/28/2015   Lab Results  Component Value Date   NA 136 10/28/2015   K 3.9 10/28/2015   CO2 32 10/28/2015   GLUCOSE 259* 10/28/2015   BUN 10 10/28/2015   CREATININE 0.54 10/28/2015   BILITOT 0.6 10/28/2015   ALKPHOS 82 10/28/2015   AST 16 10/28/2015   ALT 19 10/28/2015   PROT 6.8 10/28/2015   ALBUMIN 4.2 10/28/2015   CALCIUM 9.1 10/28/2015   GFR 124.94 10/28/2015   Lab Results  Component Value Date   CHOL  163 10/28/2015   Lab Results  Component Value Date   HDL 49.20 10/28/2015   Lab Results  Component Value Date   LDLCALC 90 10/28/2015   Lab Results  Component Value Date   TRIG 122.0 10/28/2015   Lab Results  Component Value Date   CHOLHDL 3 10/28/2015   Lab Results  Component Value Date   HGBA1C 9.8* 10/28/2015       Assessment & Plan:   Problem List Items Addressed This Visit    Hypothyroidism   Relevant Medications   levothyroxine (SYNTHROID, LEVOTHROID) 200 MCG tablet   venlafaxine XR (EFFEXOR XR) 75 MG 24 hr capsule   Other Relevant Orders   CBC (Completed)   TSH (Completed)   Comprehensive metabolic panel (Completed)   Lipid panel (Completed)   Hemoglobin A1c (Completed)   Diabetes type 2, uncontrolled ([facility])    hgba1c acceptable, minimize simple carbs. Increase exercise as tolerated. Continue current meds      Relevant Medications   rosuvastatin (CRESTOR) 20 MG tablet   lisinopril (PRINIVIL,ZESTRIL) 5 MG tablet   venlafaxine XR (EFFEXOR XR) 75 MG 24 hr capsule   Liraglutide 18 MG/3ML SOPN   Other Relevant Orders   CBC (Completed)   TSH (Completed)   Comprehensive metabolic panel (Completed)   Lipid panel (Completed)   Hemoglobin A1c (Completed)   Essential hypertension - Primary    Well controlled, no changes to meds. Encouraged heart healthy diet such as the DASH diet and exercise as tolerated.       Relevant Medications   rosuvastatin (CRESTOR) 20 MG tablet   lisinopril (PRINIVIL,ZESTRIL) 5 MG tablet   venlafaxine XR (EFFEXOR XR) 75 MG 24 hr capsule   Other Relevant Orders   CBC (Completed)   TSH (Completed)   Comprehensive metabolic panel (Completed)   Lipid panel (Completed)   Hemoglobin A1c (Completed)  TRANSAMINASES, SERUM, ELEVATED   Relevant Medications   venlafaxine XR (EFFEXOR XR) 75 MG 24 hr capsule   Other Relevant Orders   CBC (Completed)   TSH (Completed)   Comprehensive metabolic panel (Completed)   Lipid panel (Completed)    Hemoglobin A1c (Completed)   Anxiety and depression    Is struggling with mood after being fired last fall. Venlataxine XR 75 mg daily.       Preventative health care   Anemia   Relevant Medications   venlafaxine XR (EFFEXOR XR) 75 MG 24 hr capsule   Other Relevant Orders   CBC (Completed)   TSH (Completed)   Comprehensive metabolic panel (Completed)   Lipid panel (Completed)   Hemoglobin A1c (Completed)    Other Visit Diagnoses    Need for vaccination with 13-polyvalent pneumococcal conjugate vaccine        Relevant Orders    Pneumococcal conjugate vaccine 13-valent (Completed)       I have discontinued Ms. Amoroso's Liraglutide, saxagliptin HCl, venlafaxine, and Dulaglutide. I have also changed her lisinopril. Additionally, I am having her start on venlafaxine XR and Liraglutide. Lastly, I am having her maintain her ibuprofen, aspirin, calcium carbonate, potassium gluconate, cetirizine, Aspirin-Acetaminophen-Caffeine (EXCEDRIN PO), Iron Combinations (IRON COMPLEX PO), Multiple Vitamin (DAILY VITAMINS PO), Biotin, ferrous sulfate, glucose blood, glucose blood, metFORMIN, Black Cohosh-Soy-Ginkgo-Magnol (ESTROVEN MOOD & MEMORY PO), Black Cohosh-SoyIsoflav-Magnol (ESTROVEN MENOPAUSE RELIEF PO), rosuvastatin, levothyroxine, and Insulin Pen Needle.  Meds ordered this encounter  Medications  . Black Cohosh-Soy-Ginkgo-Magnol (ESTROVEN MOOD & MEMORY PO)    Sig: Take by mouth daily.  . Black Cohosh-SoyIsoflav-Magnol (ESTROVEN MENOPAUSE RELIEF PO)    Sig: Take by mouth daily.  . rosuvastatin (CRESTOR) 20 MG tablet    Sig: Take 1 tablet (20 mg total) by mouth daily.    Dispense:  90 tablet    Refill:  2    Requested drug refills are authorized, however, the patient needs further evaluation and/or laboratory testing before further refills are given. Ask her to make an appointment for this.  Marland Kitchen lisinopril (PRINIVIL,ZESTRIL) 5 MG tablet    Sig: Take 1 tablet (5 mg total) by mouth daily.     Dispense:  90 tablet    Refill:  2  . levothyroxine (SYNTHROID, LEVOTHROID) 200 MCG tablet    Sig: TAKE ONE TABLET BY MOUTH ONCE DAILY BEFORE BREAKFAST    Dispense:  90 tablet    Refill:  2  . venlafaxine XR (EFFEXOR XR) 75 MG 24 hr capsule    Sig: Take 1 capsule (75 mg total) by mouth daily with breakfast.    Dispense:  90 capsule    Refill:  3  . Insulin Pen Needle (B-D ULTRAFINE III SHORT PEN) 31G X 8 MM MISC    Sig: As directed.    Dispense:  100 each    Refill:  3  . Liraglutide 18 MG/3ML SOPN    Sig: Inject 1 mL (6 mg total) into the skin daily.    Dispense:  6 mL     Penni Homans, MD

## 2015-11-07 NOTE — Assessment & Plan Note (Signed)
Well controlled, no changes to meds. Encouraged heart healthy diet such as the DASH diet and exercise as tolerated.  °

## 2015-11-08 NOTE — Telephone Encounter (Signed)
Unable to contact the patient will close this note.

## 2015-11-25 ENCOUNTER — Telehealth: Payer: Self-pay | Admitting: Family Medicine

## 2015-11-25 MED ORDER — VENLAFAXINE HCL ER 75 MG PO CP24: 75.0000 mg | ORAL_CAPSULE | Freq: Three times a day (TID) | ORAL | Status: DC

## 2015-11-25 NOTE — Telephone Encounter (Signed)
Patient returned my phone call today. Informed of PCP instructions regarding medication. She would like a prescription of the Venlafaxine XR 75 mg take tid---Also, would like if possible a 90 day supply sent to [facility] in [place].  Advise.

## 2015-11-25 NOTE — Telephone Encounter (Signed)
Pt is returning CMA's call.    CB#: [id number]

## 2015-11-25 NOTE — Telephone Encounter (Signed)
Sent in as instructed 

## 2015-11-25 NOTE — Addendum Note (Signed)
Addended by: Sharon Seller B on: 11/25/2015 04:58 PM   Modules accepted: Orders, Medications

## 2015-11-25 NOTE — Telephone Encounter (Signed)
Completed.

## 2015-11-25 NOTE — Telephone Encounter (Signed)
OK to rx tid 90 day supply #270, 1 rf

## 2015-12-01 ENCOUNTER — Encounter: Payer: Self-pay | Admitting: Nurse Practitioner

## 2015-12-01 ENCOUNTER — Ambulatory Visit: Payer: Managed Care, Other (non HMO) | Admitting: Nurse Practitioner

## 2015-12-01 VITALS — BP 122/76 | HR 84 | Ht 63.5 in | Wt 156.0 lb

## 2015-12-01 DIAGNOSIS — Z1151 Encounter for screening for human papillomavirus (HPV): Secondary | ICD-10-CM | POA: Diagnosis not present

## 2015-12-01 DIAGNOSIS — Z01419 Encounter for gynecological examination (general) (routine) without abnormal findings: Secondary | ICD-10-CM | POA: Diagnosis not present

## 2015-12-01 DIAGNOSIS — Z0000 Encounter for general adult medical examination without abnormal findings: Secondary | ICD-10-CM

## 2015-12-01 DIAGNOSIS — IMO0002 Reserved for concepts with insufficient information to code with codable children: Secondary | ICD-10-CM

## 2015-12-01 DIAGNOSIS — N760 Acute vaginitis: Secondary | ICD-10-CM

## 2015-12-01 DIAGNOSIS — R87810 Cervical high risk human papillomavirus (HPV) DNA test positive: Secondary | ICD-10-CM | POA: Diagnosis not present

## 2015-12-01 HISTORY — DX: Reserved for concepts with insufficient information to code with codable children: IMO0002

## 2015-12-01 LAB — POCT URINALYSIS DIPSTICK
BILIRUBIN UA: NEGATIVE
Blood, UA: NEGATIVE
GLUCOSE UA: 1000
KETONES UA: NEGATIVE
LEUKOCYTES UA: NEGATIVE
Nitrite, UA: NEGATIVE
Protein, UA: NEGATIVE
Urobilinogen, UA: NEGATIVE
pH, UA: 5

## 2015-12-01 NOTE — Progress Notes (Signed)
Reviewed personally.  M. Suzanne Meline Russaw, MD.  

## 2015-12-01 NOTE — Patient Instructions (Addendum)

## 2015-12-01 NOTE — Addendum Note (Signed)
Addended by: Graylon Good on: 12/01/2015 03:54 PM   Modules accepted: Orders

## 2015-12-01 NOTE — Progress Notes (Signed)
Patient ID: Colleen Baldwin, female   DOB: 02-Aug-1960, 55 y.o.   MRN: [id number]   55 y.o. G1P0010 Single  Caucasian Fe here for annual exam.  Last HGB AIC 9.8 in June.  Some vaso symptoms.  Increase in white vaginal discharge and has OTC Monistat but has not used recently.  Some increase in anxiety since she terminated her job last fall.  She is considering going back to school maybe in nursing this coming fall.  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  Patient's last menstrual period was 05/08/2008 (approximate).          Sexually active: No.  The current method of family planning is none.    Exercising: No.  The patient does not participate in regular exercise at present. Smoker:  no  Health Maintenance: Pap:  11/06/12, negative with neg HR HPV MMG: 12/01/14, Bilateral Diagnostic, Bi-Rads 2: Benign; screening in one year; Right Breast Ultrasound 08/23/15, Bi-Rads 3: Probably Benign; recommend bilateral mammogram and right breast ultrasound in 11/2015; pt not currently scheduled Colonoscopy:  12/01/11, polyps, repeat in 3 years TDaP:  06/24/14 HIV: 11/24/14 Labs: See EPIC   Urine: 1000 glucose all else negative   reports that she has never smoked. She has never used smokeless tobacco. She reports that she drinks about 0.6 oz of alcohol per week . She reports that she does not use drugs.  Past Medical History:  Diagnosis Date  . Anemia 06/10/2013  . Asthma   . Depression   . Diabetes mellitus    Type II  uncontrolled  . Dysplasia of cervix, low grade (CIN 1) 1992   Cryo   . Hyperlipidemia   . Hypertension   . Hypothyroidism    s/p thyroidectomy for goiter  . Personal history of colonic polyps 11/20/2012   Follows with [facility] in [place]  . Preventative health care 11/20/2012  . TMJ dysfunction 06/10/2013    Past Surgical History:  Procedure Laterality Date  . CHOLECYSTECTOMY  05-2004  . IUD REMOVAL  07/20/2008   Of embedded paraguard  . LAPAROSCOPIC CHOLECYSTECTOMY  2006  .  THYROIDECTOMY  09/1994  . TONSILLECTOMY AND ADENOIDECTOMY      Current Outpatient Prescriptions  Medication Sig Dispense Refill  . aspirin 81 MG tablet Take 81 mg by mouth daily.      . Aspirin-Acetaminophen-Caffeine (EXCEDRIN PO) Take by mouth as needed.    . Biotin 10 MG TABS Take 1 tablet by mouth every morning.    . Black Cohosh-Soy-Ginkgo-Magnol (ESTROVEN MOOD & MEMORY PO) Take by mouth daily.    . Black Cohosh-SoyIsoflav-Magnol (ESTROVEN MENOPAUSE RELIEF PO) Take by mouth daily.    . calcium carbonate (OS-CAL) 600 MG TABS Take 600 mg by mouth 2 (two) times daily with meals.      . cetirizine (ZYRTEC) 10 MG tablet Take 1 tablet by mouth 1-2 times a week.     . ferrous sulfate 325 (65 FE) MG tablet Take 325 mg by mouth every evening.    Marland Kitchen glucose blood (ACCU-CHEK AVIVA) test strip Check BS bid and prn.  DX: 250.02 100 each 3  . glucose blood test strip 1 each by Other route 2 (two) times daily as needed for other. Use as instructed 100 each 12  . ibuprofen (ADVIL,MOTRIN) 200 MG tablet Take 2-3 tablets by mouth up to 3 times a day as needed for pain.     . Insulin Pen Needle (B-D ULTRAFINE III SHORT PEN) 31G X 8 MM MISC As  directed. 100 each 3  . Iron Combinations (IRON COMPLEX PO) Take 27 mg by mouth daily.    Marland Kitchen levothyroxine (SYNTHROID, LEVOTHROID) 200 MCG tablet TAKE ONE TABLET BY MOUTH ONCE DAILY BEFORE BREAKFAST 90 tablet 2  . Liraglutide 18 MG/3ML SOPN Inject 1 mL (6 mg total) into the skin daily. 6 mL   . lisinopril (PRINIVIL,ZESTRIL) 5 MG tablet Take 1 tablet (5 mg total) by mouth daily. 90 tablet 2  . metFORMIN (GLUCOPHAGE) 1000 MG tablet TAKE ONE TABLET BY MOUTH TWICE DAILY WITH MEALS 180 tablet 0  . Misc Natural Products (ESTROVEN + ENERGY MAX STRENGTH) TABS Take 1 tablet by mouth daily.    . Multiple Vitamin (DAILY VITAMINS PO) Take by mouth.    . Potassium Gluconate 595 MG TABS Take 2 tablets by mouth daily.     . rosuvastatin (CRESTOR) 20 MG tablet Take 1 tablet (20 mg  total) by mouth daily. 90 tablet 2  . venlafaxine XR (EFFEXOR XR) 75 MG 24 hr capsule Take 1 capsule (75 mg total) by mouth 3 (three) times daily. 270 capsule 1   No current facility-administered medications for this visit.     Family History  Problem Relation Age of Onset  . Cancer Mother     ?uterine  . Diabetes Father   . Thyroid disease Father   . Stroke Father   . Hypertension Sister   . Heart failure Maternal Grandfather   . Diabetes Paternal Grandmother   . Heart failure Paternal Grandfather     ROS:  Pertinent items are noted in HPI.  Otherwise, a comprehensive ROS was negative.  Exam:   BP 122/76 (BP Location: Right Arm, Patient Position: Sitting, Cuff Size: Normal)   Pulse 84   Ht 5' 3.5" (1.613 m)   Wt 156 lb (70.8 kg)   LMP 05/08/2008 (Approximate)   BMI 27.20 kg/m  Height: 5' 3.5" (161.3 cm) Ht Readings from Last 3 Encounters:  12/01/15 5' 3.5" (1.613 m)  10/28/15 5\' 4"  (1.626 m)  05/20/15 5\' 4"  (1.626 m)    General appearance: alert, cooperative and appears stated age Head: Normocephalic, without obvious abnormality, atraumatic Neck: no adenopathy, supple, symmetrical, trachea midline and thyroid normal to inspection and palpation Lungs: clear to auscultation bilaterally Breasts: normal appearance, no masses or tenderness Heart: regular rate and rhythm Abdomen: soft, non-tender; no masses,  no organomegaly Extremities: extremities normal, atraumatic, no cyanosis or edema Skin: Skin color, texture, turgor normal. No rashes or lesions Lymph nodes: Cervical, supraclavicular, and axillary nodes normal. No abnormal inguinal nodes palpated Neurologic: Grossly normal   Pelvic: External genitalia:  no lesions              Urethra:  normal appearing urethra with no masses, tenderness or lesions              Bartholin's and Skene's: normal                 Vagina: normal appearing vagina with normal color and white discharge, no lesions              Cervix:  anteverted              Pap taken: Yes.   Bimanual Exam:  Uterus:  normal size, contour, position, consistency, mobility, non-tender              Adnexa: no mass, fullness, tenderness               Rectovaginal: Confirms  Anus:  normal sphincter tone, no lesions  Chaperone present: yes  A:  Well Woman with normal exam  Postmenopausal no HRT             History of DM - poor control; hypothyroid             R/O STD's             History of CIN I with Cryo 1992  R/O Vaginitis  P:   Reviewed health and wellness pertinent to exam  Pap smear was done today  Mammogram is  due now and will schedule  Colonoscopy is due now and will schedule - she is having to work around insurance issues.  Will follow with [facility]  She will have PCP to check Vit D  Counseled on breast self exam, mammography screening, adequate intake of calcium and vitamin D, diet and exercise, Kegel's exercises return annually or prn  An After Visit Summary was printed and given to the patient.

## 2015-12-02 LAB — WET PREP BY MOLECULAR PROBE
Candida species: NEGATIVE
GARDNERELLA VAGINALIS: NEGATIVE
Trichomonas vaginosis: NEGATIVE

## 2015-12-03 LAB — IPS PAP TEST WITH HPV

## 2015-12-06 NOTE — Addendum Note (Signed)
Addended by: Antonietta Barcelona on: 12/06/2015 08:22 AM   Modules accepted: Orders

## 2015-12-07 ENCOUNTER — Encounter: Payer: Self-pay | Admitting: Nurse Practitioner

## 2015-12-07 LAB — IPS HPV GENOTYPING 16/18

## 2015-12-09 ENCOUNTER — Telehealth: Payer: Self-pay | Admitting: *Deleted

## 2015-12-09 NOTE — Telephone Encounter (Signed)
Patient is in 04 recall for Bilaterial DX MMG. Please contact patient and schedule  Thanks Margaretha Sheffield

## 2015-12-16 NOTE — Telephone Encounter (Signed)
Message left for patient to return call regarding follow up mammogram.

## 2015-12-17 ENCOUNTER — Other Ambulatory Visit: Payer: Self-pay | Admitting: Nurse Practitioner

## 2015-12-17 DIAGNOSIS — N63 Unspecified lump in unspecified breast: Secondary | ICD-10-CM

## 2015-12-21 ENCOUNTER — Telehealth: Payer: Self-pay | Admitting: *Deleted

## 2015-12-21 NOTE — Telephone Encounter (Signed)
-----   Message from Kem Boroughs, [place] sent at 12/07/2015 12:33 PM EDT ----- Please let pt know that her pap was normal but + HR HPV.  She was then tested for genotype # 16/18/45 and all were negative.  She still needs a repeat pap in 1 year - 08 recall.

## 2015-12-21 NOTE — Telephone Encounter (Signed)
Call to patient to review pap result. Left message to call back and ask for triage nurse.

## 2015-12-23 ENCOUNTER — Ambulatory Visit
Admission: RE | Admit: 2015-12-23 | Discharge: 2015-12-23 | Disposition: A | Discharge status: Home / Self Care | Payer: BLUE CROSS/BLUE SHIELD | Source: Ambulatory Visit | Attending: Nurse Practitioner | Admitting: Nurse Practitioner

## 2015-12-23 DIAGNOSIS — N63 Unspecified lump in unspecified breast: Secondary | ICD-10-CM

## 2015-12-23 DIAGNOSIS — R922 Inconclusive mammogram: Secondary | ICD-10-CM | POA: Diagnosis not present

## 2015-12-23 NOTE — Telephone Encounter (Signed)
Patient had Bilateral DX MMG on 12/23/15. Report in chart. Results is BIRADS2. -sco

## 2015-12-31 NOTE — Telephone Encounter (Signed)
Left message to call Colleen Baldwin at [phone]. 

## 2016-01-04 NOTE — Telephone Encounter (Signed)
Spoke with patient. Advised of message and results as seen below from Kem Boroughs, [place]. She is agreeable and verbalizes understanding. Aex is scheduled for 12/05/2016. 08 recall placed.  Routing to provider for final review. Patient agreeable to disposition. Will close encounter.

## 2016-01-10 ENCOUNTER — Other Ambulatory Visit: Payer: Self-pay | Admitting: Family Medicine

## 2016-01-28 ENCOUNTER — Ambulatory Visit: Payer: BLUE CROSS/BLUE SHIELD | Admitting: Family Medicine

## 2016-03-07 ENCOUNTER — Encounter: Payer: Self-pay | Admitting: Family Medicine

## 2016-03-07 ENCOUNTER — Ambulatory Visit (INDEPENDENT_AMBULATORY_CARE_PROVIDER_SITE_OTHER): Payer: BLUE CROSS/BLUE SHIELD | Admitting: Family Medicine

## 2016-03-07 DIAGNOSIS — E1165 Type 2 diabetes mellitus with hyperglycemia: Secondary | ICD-10-CM | POA: Diagnosis not present

## 2016-03-07 DIAGNOSIS — E890 Postprocedural hypothyroidism: Secondary | ICD-10-CM

## 2016-03-07 DIAGNOSIS — E118 Type 2 diabetes mellitus with unspecified complications: Secondary | ICD-10-CM

## 2016-03-07 DIAGNOSIS — M545 Low back pain, unspecified: Secondary | ICD-10-CM | POA: Insufficient documentation

## 2016-03-07 DIAGNOSIS — Z23 Encounter for immunization: Secondary | ICD-10-CM

## 2016-03-07 DIAGNOSIS — R7401 Elevation of levels of liver transaminase levels: Secondary | ICD-10-CM

## 2016-03-07 DIAGNOSIS — R740 Nonspecific elevation of levels of transaminase and lactic acid dehydrogenase [LDH]: Secondary | ICD-10-CM | POA: Diagnosis not present

## 2016-03-07 DIAGNOSIS — R7402 Elevation of levels of lactic acid dehydrogenase (LDH): Secondary | ICD-10-CM

## 2016-03-07 DIAGNOSIS — I10 Essential (primary) hypertension: Secondary | ICD-10-CM

## 2016-03-07 DIAGNOSIS — E782 Mixed hyperlipidemia: Secondary | ICD-10-CM

## 2016-03-07 DIAGNOSIS — IMO0002 Reserved for concepts with insufficient information to code with codable children: Secondary | ICD-10-CM

## 2016-03-07 HISTORY — DX: Low back pain, unspecified: M54.50

## 2016-03-07 LAB — CBC
HEMATOCRIT: 41.3 % (ref 36.0–46.0)
Hemoglobin: 14.3 g/dL (ref 12.0–15.0)
MCHC: 34.5 g/dL (ref 30.0–36.0)
MCV: 87.5 fl (ref 78.0–100.0)
Platelets: 290 10*3/uL (ref 150.0–400.0)
RBC: 4.72 Mil/uL (ref 3.87–5.11)
RDW: 12.9 % (ref 11.5–15.5)
WBC: 9.3 10*3/uL (ref 4.0–10.5)

## 2016-03-07 LAB — COMPREHENSIVE METABOLIC PANEL
ALT: 16 U/L (ref 0–35)
AST: 15 U/L (ref 0–37)
Albumin: 4.2 g/dL (ref 3.5–5.2)
Alkaline Phosphatase: 109 U/L (ref 39–117)
BILIRUBIN TOTAL: 0.6 mg/dL (ref 0.2–1.2)
BUN: 14 mg/dL (ref 6–23)
CO2: 28 meq/L (ref 19–32)
Calcium: 9.3 mg/dL (ref 8.4–10.5)
Chloride: 97 mEq/L (ref 96–112)
Creatinine, Ser: 0.55 mg/dL (ref 0.40–1.20)
GFR: 122.16 mL/min (ref >60.00)
GLUCOSE: 300 mg/dL — AB (ref 70–99)
Potassium: 3.9 mEq/L (ref 3.5–5.1)
SODIUM: 136 meq/L (ref 135–145)
TOTAL PROTEIN: 6.9 g/dL (ref 6.0–8.3)

## 2016-03-07 LAB — LIPID PANEL
CHOL/HDL RATIO: 3
Cholesterol: 167 mg/dL (ref 0–200)
HDL: 51.4 mg/dL (ref >39.00)
LDL Cholesterol: 82 mg/dL (ref 0–99)
NONHDL: 115.42
Triglycerides: 169 mg/dL — ABNORMAL HIGH (ref 0.0–149.0)
VLDL: 33.8 mg/dL (ref 0.0–40.0)

## 2016-03-07 LAB — TSH: TSH: 1.16 u[IU]/mL (ref 0.35–4.50)

## 2016-03-07 LAB — HEMOGLOBIN A1C: HEMOGLOBIN A1C: 11 % — AB (ref 4.6–6.5)

## 2016-03-07 MED ORDER — METFORMIN HCL 1000 MG PO TABS: 1000.0000 mg | ORAL_TABLET | Freq: Two times a day (BID) | ORAL | 0 refills | Status: DC

## 2016-03-07 MED ORDER — ROSUVASTATIN CALCIUM 20 MG PO TABS: 20.0000 mg | ORAL_TABLET | Freq: Every day | ORAL | 2 refills | Status: DC

## 2016-03-07 MED ORDER — VENLAFAXINE HCL ER 75 MG PO CP24: 75.0000 mg | ORAL_CAPSULE | Freq: Three times a day (TID) | ORAL | 1 refills | Status: DC

## 2016-03-07 MED ORDER — LISINOPRIL 5 MG PO TABS: 5.0000 mg | ORAL_TABLET | Freq: Every day | ORAL | 2 refills | Status: DC

## 2016-03-07 MED ORDER — LEVOTHYROXINE SODIUM 200 MCG PO TABS: ORAL_TABLET | ORAL | 2 refills | Status: DC

## 2016-03-07 NOTE — Assessment & Plan Note (Signed)
Encouraged moist heat and gentle stretching as tolerated. May try NSAIDs and prescription meds as directed and report if symptoms worsen or seek immediate care. Lidocaine patches or gel

## 2016-03-07 NOTE — Assessment & Plan Note (Signed)
Encouraged heart healthy diet, increase exercise, avoid trans fats, consider a krill oil cap daily. Tolerating Crestor 

## 2016-03-07 NOTE — Assessment & Plan Note (Signed)
On Levothyroxine, continue to monitor 

## 2016-03-07 NOTE — Progress Notes (Signed)
Patient ID: Colleen Baldwin, female   DOB: 09/26/1960, 55 y.o.   MRN: [id number]   Subjective:    Patient ID: Colleen Baldwin, female    DOB: [birth year], 55 y.o.   MRN: [id number]  Chief Complaint  Patient presents with  . Follow-up    HPI Patient is in today for follow up. She is struggling with intermittent low back secondary to having to carry a heavy back pack for school. No falls, radicular symptoms or incontinence. Denies CP/palp/SOB/HA/congestion/fevers/GI or GU c/o. Taking meds as prescribed  Past Medical History:  Diagnosis Date  . Anemia 06/10/2013  . Asthma   . Depression   . Diabetes mellitus    Type II  uncontrolled  . Dysplasia of cervix, low grade (CIN 1) 1992   Cryo   . HPV test positive, pap was normal 12/01/2015   genotype # 16/18/45 was negative.  Repeat pap 2018  . Hyperlipidemia   . Hypertension   . Hypothyroidism    s/p thyroidectomy for goiter  . Low back pain 03/07/2016  . Personal history of colonic polyps 11/20/2012   Follows with [facility] in [place]  . Preventative health care 11/20/2012  . TMJ dysfunction 06/10/2013    Past Surgical History:  Procedure Laterality Date  . CHOLECYSTECTOMY  05-2004  . IUD REMOVAL  07/20/2008   Of embedded paraguard  . LAPAROSCOPIC CHOLECYSTECTOMY  2006  . THYROIDECTOMY  09/1994  . TONSILLECTOMY AND ADENOIDECTOMY      Family History  Problem Relation Age of Onset  . Cancer Mother     ?uterine  . Diabetes Father   . Thyroid disease Father   . Stroke Father   . Hypertension Sister   . Diabetes Sister   . Heart failure Maternal Grandfather   . Diabetes Paternal Grandmother   . Heart failure Paternal Grandfather   . Thyroid disease Sister     Social History   Social History  . Marital status: Legally Separated    Spouse name: N/A  . Number of children: 0  . Years of education: N/A   Occupational History  . INTERIOR DESIGNER [employer]   Social History Main Topics  . Smoking status:  Never Smoker  . Smokeless tobacco: Never Used  . Alcohol use 0.6 oz/week    1 Standard drinks or equivalent per week  . Drug use: No  . Sexual activity: No   Other Topics Concern  . Not on file   Social History Narrative  . No narrative on file    Outpatient Medications Prior to Visit  Medication Sig Dispense Refill  . aspirin 81 MG tablet Take 81 mg by mouth daily.      . Aspirin-Acetaminophen-Caffeine (EXCEDRIN PO) Take by mouth as needed.    . Biotin 10 MG TABS Take 1 tablet by mouth every morning.    . Black Cohosh-Soy-Ginkgo-Magnol (ESTROVEN MOOD & MEMORY PO) Take by mouth daily.    . Black Cohosh-SoyIsoflav-Magnol (ESTROVEN MENOPAUSE RELIEF PO) Take by mouth daily.    . calcium carbonate (OS-CAL) 600 MG TABS Take 600 mg by mouth 2 (two) times daily with meals.      . cetirizine (ZYRTEC) 10 MG tablet Take 1 tablet by mouth 1-2 times a week.     . ferrous sulfate 325 (65 FE) MG tablet Take 325 mg by mouth every evening.    Marland Kitchen glucose blood (ACCU-CHEK AVIVA) test strip Check BS bid and prn.  DX: 250.02 100 each 3  . glucose  blood test strip 1 each by Other route 2 (two) times daily as needed for other. Use as instructed 100 each 12  . ibuprofen (ADVIL,MOTRIN) 200 MG tablet Take 2-3 tablets by mouth up to 3 times a day as needed for pain.     . Insulin Pen Needle (B-D ULTRAFINE III SHORT PEN) 31G X 8 MM MISC As directed. 100 each 3  . Iron Combinations (IRON COMPLEX PO) Take 27 mg by mouth daily.    . Liraglutide 18 MG/3ML SOPN Inject 1 mL (6 mg total) into the skin daily. 6 mL   . Misc Natural Products (ESTROVEN + ENERGY MAX STRENGTH) TABS Take 1 tablet by mouth daily.    . Multiple Vitamin (DAILY VITAMINS PO) Take by mouth.    . Potassium Gluconate 595 MG TABS Take 2 tablets by mouth daily.     Marland Kitchen levothyroxine (SYNTHROID, LEVOTHROID) 200 MCG tablet TAKE ONE TABLET BY MOUTH ONCE DAILY BEFORE BREAKFAST 90 tablet 2  . lisinopril (PRINIVIL,ZESTRIL) 5 MG tablet Take 1 tablet (5 mg  total) by mouth daily. 90 tablet 2  . metFORMIN (GLUCOPHAGE) 1000 MG tablet TAKE ONE TABLET BY MOUTH TWICE DAILY WITH MEALS 180 tablet 0  . rosuvastatin (CRESTOR) 20 MG tablet Take 1 tablet (20 mg total) by mouth daily. 90 tablet 2  . venlafaxine XR (EFFEXOR XR) 75 MG 24 hr capsule Take 1 capsule (75 mg total) by mouth 3 (three) times daily. 270 capsule 1   No facility-administered medications prior to visit.     Allergies  Allergen Reactions  . Shrimp [Shellfish Allergy] Swelling    Review of Systems  Constitutional: Positive for malaise/fatigue. Negative for fever.  HENT: Negative for congestion.   Eyes: Negative for blurred vision.  Respiratory: Negative for shortness of breath.   Cardiovascular: Negative for chest pain, palpitations and leg swelling.  Gastrointestinal: Negative for abdominal pain, blood in stool and nausea.  Genitourinary: Negative for dysuria and frequency.  Musculoskeletal: Positive for back pain. Negative for falls.  Skin: Negative for rash.  Neurological: Negative for dizziness, loss of consciousness and headaches.  Endo/Heme/Allergies: Negative for environmental allergies.  Psychiatric/Behavioral: Negative for depression. The patient is not nervous/anxious.        Objective:    Physical Exam  Constitutional: She is oriented to person, place, and time. She appears well-developed and well-nourished. No distress.  HENT:  Head: Normocephalic and atraumatic.  Nose: Nose normal.  Eyes: Right eye exhibits no discharge. Left eye exhibits no discharge.  Neck: Normal range of motion. Neck supple.  Cardiovascular: Normal rate and regular rhythm.   No murmur heard. Pulmonary/Chest: Effort normal and breath sounds normal.  Abdominal: Soft. Bowel sounds are normal. There is no tenderness.  Musculoskeletal: She exhibits no edema.  Neurological: She is alert and oriented to person, place, and time.  Skin: Skin is warm and dry.  Psychiatric: She has a normal mood  and affect.  Nursing note and vitals reviewed.   BP 122/78 (BP Location: Left Arm, Patient Position: Sitting, Cuff Size: Normal)   Pulse 99   Temp 98.2 F (36.8 C) (Oral)   Ht 5\' 4"  (1.626 m)   Wt 171 lb (77.6 kg)   SpO2 97%   BMI 29.35 kg/m  Wt Readings from Last 3 Encounters:  03/07/16 171 lb (77.6 kg)  12/01/15 156 lb (70.8 kg)  10/28/15 160 lb 8 oz (72.8 kg)     Lab Results  Component Value Date   WBC 8.9 10/28/2015  HGB 13.8 10/28/2015   HCT 40.6 10/28/2015   PLT 283.0 10/28/2015   GLUCOSE 259 (H) 10/28/2015   CHOL 163 10/28/2015   TRIG 122.0 10/28/2015   HDL 49.20 10/28/2015   LDLCALC 90 10/28/2015   ALT 19 10/28/2015   AST 16 10/28/2015   NA 136 10/28/2015   K 3.9 10/28/2015   CL 101 10/28/2015   CREATININE 0.54 10/28/2015   BUN 10 10/28/2015   CO2 32 10/28/2015   TSH 1.83 10/28/2015   HGBA1C 9.8 (H) 10/28/2015   MICROALBUR 3.0 (H) 06/24/2014    Lab Results  Component Value Date   TSH 1.83 10/28/2015   Lab Results  Component Value Date   WBC 8.9 10/28/2015   HGB 13.8 10/28/2015   HCT 40.6 10/28/2015   MCV 90.0 10/28/2015   PLT 283.0 10/28/2015   Lab Results  Component Value Date   NA 136 10/28/2015   K 3.9 10/28/2015   CO2 32 10/28/2015   GLUCOSE 259 (H) 10/28/2015   BUN 10 10/28/2015   CREATININE 0.54 10/28/2015   BILITOT 0.6 10/28/2015   ALKPHOS 82 10/28/2015   AST 16 10/28/2015   ALT 19 10/28/2015   PROT 6.8 10/28/2015   ALBUMIN 4.2 10/28/2015   CALCIUM 9.1 10/28/2015   GFR 124.94 10/28/2015   Lab Results  Component Value Date   CHOL 163 10/28/2015   Lab Results  Component Value Date   HDL 49.20 10/28/2015   Lab Results  Component Value Date   LDLCALC 90 10/28/2015   Lab Results  Component Value Date   TRIG 122.0 10/28/2015   Lab Results  Component Value Date   CHOLHDL 3 10/28/2015   Lab Results  Component Value Date   HGBA1C 9.8 (H) 10/28/2015       Assessment & Plan:   Problem List Items Addressed This  Visit    Hypothyroidism    On Levothyroxine, continue to monitor      Relevant Medications   venlafaxine XR (EFFEXOR XR) 75 MG 24 hr capsule   levothyroxine (SYNTHROID, LEVOTHROID) 200 MCG tablet   Diabetes type 2, uncontrolled ([place])    hgba1c unacceptable, minimize simple carbs. Increase exercise as tolerated. Never restarted the Victoza as planned but agrees to try again. She did have some trouble       Relevant Medications   venlafaxine XR (EFFEXOR XR) 75 MG 24 hr capsule   lisinopril (PRINIVIL,ZESTRIL) 5 MG tablet   rosuvastatin (CRESTOR) 20 MG tablet   metFORMIN (GLUCOPHAGE) 1000 MG tablet   Other Relevant Orders   Hemoglobin A1c   Hyperlipidemia, mixed    Encouraged heart healthy diet, increase exercise, avoid trans fats, consider a krill oil cap daily. Tolerating Crestor      Relevant Medications   lisinopril (PRINIVIL,ZESTRIL) 5 MG tablet   rosuvastatin (CRESTOR) 20 MG tablet   Other Relevant Orders   Lipid panel   Essential hypertension    Well controlled, no changes to meds. Encouraged heart healthy diet such as the DASH diet and exercise as tolerated.       Relevant Medications   venlafaxine XR (EFFEXOR XR) 75 MG 24 hr capsule   lisinopril (PRINIVIL,ZESTRIL) 5 MG tablet   rosuvastatin (CRESTOR) 20 MG tablet   Other Relevant Orders   TSH   CBC   Comprehensive metabolic panel   TRANSAMINASES, SERUM, ELEVATED   Relevant Medications   venlafaxine XR (EFFEXOR XR) 75 MG 24 hr capsule   Low back pain    Encouraged moist  heat and gentle stretching as tolerated. May try NSAIDs and prescription meds as directed and report if symptoms worsen or seek immediate care. Lidocaine patches or gel       Other Visit Diagnoses    Encounter for immunization       Relevant Orders   Flu Vaccine QUAD 36+ mos IM (Completed)      I have changed Ms. Mckinzie's metFORMIN. I am also having her maintain her ibuprofen, aspirin, calcium carbonate, potassium gluconate, cetirizine,  Aspirin-Acetaminophen-Caffeine (EXCEDRIN PO), Iron Combinations (IRON COMPLEX PO), Multiple Vitamin (DAILY VITAMINS PO), Biotin, ferrous sulfate, glucose blood, glucose blood, Black Cohosh-Soy-Ginkgo-Magnol (ESTROVEN MOOD & MEMORY PO), Black Cohosh-SoyIsoflav-Magnol (ESTROVEN MENOPAUSE RELIEF PO), Insulin Pen Needle, liraglutide, ESTROVEN + ENERGY MAX STRENGTH, venlafaxine XR, lisinopril, rosuvastatin, and levothyroxine.  Meds ordered this encounter  Medications  . venlafaxine XR (EFFEXOR XR) 75 MG 24 hr capsule    Sig: Take 1 capsule (75 mg total) by mouth 3 (three) times daily.    Dispense:  270 capsule    Refill:  1  . lisinopril (PRINIVIL,ZESTRIL) 5 MG tablet    Sig: Take 1 tablet (5 mg total) by mouth daily.    Dispense:  90 tablet    Refill:  2  . rosuvastatin (CRESTOR) 20 MG tablet    Sig: Take 1 tablet (20 mg total) by mouth daily.    Dispense:  90 tablet    Refill:  2  . levothyroxine (SYNTHROID, LEVOTHROID) 200 MCG tablet    Sig: TAKE ONE TABLET BY MOUTH ONCE DAILY BEFORE BREAKFAST    Dispense:  90 tablet    Refill:  2  . metFORMIN (GLUCOPHAGE) 1000 MG tablet    Sig: Take 1 tablet (1,000 mg total) by mouth 2 (two) times daily with a meal.    Dispense:  180 tablet    Refill:  0     Penni Homans, MD

## 2016-03-07 NOTE — Assessment & Plan Note (Signed)
Well controlled, no changes to meds. Encouraged heart healthy diet such as the DASH diet and exercise as tolerated.  °

## 2016-03-07 NOTE — Progress Notes (Signed)
Pre visit review using our clinic review tool, if applicable. No additional management support is needed unless otherwise documented below in the visit note. 

## 2016-03-07 NOTE — Patient Instructions (Signed)
Lidocaine patches or gel by Jenel Lucks, [employer]   Back Pain, Adult Back pain is very common in adults.The cause of back pain is rarely dangerous and the pain often gets better over time.The cause of your back pain may not be known. Some common causes of back pain include:  Strain of the muscles or ligaments supporting the spine.  Wear and tear (degeneration) of the spinal disks.  Arthritis.  Direct injury to the back. For many people, back pain may return. Since back pain is rarely dangerous, most people can learn to manage this condition on their own. HOME CARE INSTRUCTIONS Watch your back pain for any changes. The following actions may help to lessen any discomfort you are feeling:  Remain active. It is stressful on your back to sit or stand in one place for long periods of time. Do not sit, drive, or stand in one place for more than 30 minutes at a time. Take short walks on even surfaces as soon as you are able.Try to increase the length of time you walk each day.  Exercise regularly as directed by your health care provider. Exercise helps your back heal faster. It also helps avoid future injury by keeping your muscles strong and flexible.  Do not stay in bed.Resting more than 1-2 days can delay your recovery.  Pay attention to your body when you bend and lift. The most comfortable positions are those that put less stress on your recovering back. Always use proper lifting techniques, including:  Bending your knees.  Keeping the load close to your body.  Avoiding twisting.  Find a comfortable position to sleep. Use a firm mattress and lie on your side with your knees slightly bent. If you lie on your back, put a pillow under your knees.  Avoid feeling anxious or stressed.Stress increases muscle tension and can worsen back pain.It is important to recognize when you are anxious or stressed and learn ways to manage it, such as with exercise.  Take medicines only as directed  by your health care provider. Over-the-counter medicines to reduce pain and inflammation are often the most helpful.Your health care provider may prescribe muscle relaxant drugs.These medicines help dull your pain so you can more quickly return to your normal activities and healthy exercise.  Apply ice to the injured area:  Put ice in a plastic bag.  Place a towel between your skin and the bag.  Leave the ice on for 20 minutes, 2-3 times a day for the first 2-3 days. After that, ice and heat may be alternated to reduce pain and spasms.  Maintain a healthy weight. Excess weight puts extra stress on your back and makes it difficult to maintain good posture. SEEK MEDICAL CARE IF:  You have pain that is not relieved with rest or medicine.  You have increasing pain going down into the legs or buttocks.  You have pain that does not improve in one week.  You have night pain.  You lose weight.  You have a fever or chills. SEEK IMMEDIATE MEDICAL CARE IF:   You develop new bowel or bladder control problems.  You have unusual weakness or numbness in your arms or legs.  You develop nausea or vomiting.  You develop abdominal pain.  You feel faint.   This information is not intended to replace advice given to you by your health care provider. Make sure you discuss any questions you have with your health care provider.   Document Released: 04/24/2005 Document Revised:  05/15/2014 Document Reviewed: 08/26/2013 Elsevier Interactive Patient Education [employer].

## 2016-03-07 NOTE — Assessment & Plan Note (Addendum)
hgba1c unacceptable, minimize simple carbs. Increase exercise as tolerated. Never restarted the Victoza as planned but agrees to try again. She did have some trouble

## 2016-03-13 ENCOUNTER — Encounter: Payer: Self-pay | Admitting: Family Medicine

## 2016-08-01 ENCOUNTER — Other Ambulatory Visit: Payer: Self-pay | Admitting: Family Medicine

## 2016-08-28 ENCOUNTER — Telehealth: Payer: Self-pay | Admitting: Family Medicine

## 2016-08-28 NOTE — Telephone Encounter (Signed)
Pt called in to request a TB test. Pt says that she needs in as soon as possible for school. Please place orders   I will call pt back to schedule.    CB: [phone]

## 2016-08-28 NOTE — Telephone Encounter (Signed)
OK to place PPD for TB testing for her job. Please arrange

## 2016-08-29 NOTE — Telephone Encounter (Signed)
Appointment scheduled for 08/30/16 at 10:00 AM for PPD placement.

## 2016-08-29 NOTE — Telephone Encounter (Signed)
Attempted to reach patient to schedule appointment for TB skin test. Left message for patient to return call when available.

## 2016-08-30 ENCOUNTER — Ambulatory Visit: Payer: BLUE CROSS/BLUE SHIELD | Admitting: Family Medicine

## 2016-08-30 ENCOUNTER — Ambulatory Visit (INDEPENDENT_AMBULATORY_CARE_PROVIDER_SITE_OTHER): Payer: BLUE CROSS/BLUE SHIELD | Admitting: *Deleted

## 2016-08-30 ENCOUNTER — Telehealth: Payer: Self-pay | Admitting: Family Medicine

## 2016-08-30 DIAGNOSIS — Z111 Encounter for screening for respiratory tuberculosis: Secondary | ICD-10-CM

## 2016-08-30 NOTE — Progress Notes (Signed)
PPD Placement note Colleen Baldwin, 56 y.o. female is here today for placement of PPD test Tuberculin skin test applied to Left ventral forearm, pt alert and oriented, tolerated well Reason for PPD test: School Pt taken PPD test before: yes Has the patient ever received the BCG vaccine?: yes PPD placed on 08/30/2016.  Patient advised to return for reading within 48-72 hours; pt understood & agreed/SLS 04/25

## 2016-08-30 NOTE — Telephone Encounter (Signed)
Pt request two hard copies of her lab work.

## 2016-09-01 ENCOUNTER — Ambulatory Visit (INDEPENDENT_AMBULATORY_CARE_PROVIDER_SITE_OTHER): Payer: BLUE CROSS/BLUE SHIELD | Admitting: Behavioral Health

## 2016-09-01 DIAGNOSIS — Z111 Encounter for screening for respiratory tuberculosis: Secondary | ICD-10-CM | POA: Diagnosis not present

## 2016-09-01 LAB — TB SKIN TEST
INDURATION: 0 mm
TB Skin Test: NEGATIVE

## 2016-09-01 NOTE — Progress Notes (Addendum)
Pre visit review using our clinic review tool, if applicable. No additional management support is needed unless otherwise documented below in the visit note.  Patient came in clinic to have PPD skin test read within the recommended 48-72 hours of placement. Provider's order was given per telephone note 08/28/16. PPD was placed on 08/30/16. See clinical documentation note on 08/30/16. Today's results were negative; 0 mm induration. No chest x-ray was required. Letters given to patient for school & employment.

## 2016-10-26 ENCOUNTER — Other Ambulatory Visit: Payer: Self-pay | Admitting: Family Medicine

## 2016-10-26 DIAGNOSIS — D649 Anemia, unspecified: Secondary | ICD-10-CM

## 2016-10-26 DIAGNOSIS — IMO0002 Reserved for concepts with insufficient information to code with codable children: Secondary | ICD-10-CM

## 2016-10-26 DIAGNOSIS — R7401 Elevation of levels of liver transaminase levels: Secondary | ICD-10-CM

## 2016-10-26 DIAGNOSIS — I10 Essential (primary) hypertension: Secondary | ICD-10-CM

## 2016-10-26 DIAGNOSIS — E118 Type 2 diabetes mellitus with unspecified complications: Secondary | ICD-10-CM

## 2016-10-26 DIAGNOSIS — E1165 Type 2 diabetes mellitus with hyperglycemia: Secondary | ICD-10-CM

## 2016-10-26 DIAGNOSIS — E890 Postprocedural hypothyroidism: Secondary | ICD-10-CM

## 2016-10-26 DIAGNOSIS — R7402 Elevation of levels of lactic acid dehydrogenase (LDH): Secondary | ICD-10-CM

## 2016-10-26 DIAGNOSIS — R740 Nonspecific elevation of levels of transaminase and lactic acid dehydrogenase [LDH]: Secondary | ICD-10-CM

## 2016-11-06 ENCOUNTER — Telehealth: Payer: Self-pay | Admitting: Nurse Practitioner

## 2016-11-06 NOTE — Telephone Encounter (Signed)
Left message regarding upcoming appointment has been canceled and needs to be rescheduled. °

## 2016-11-10 ENCOUNTER — Telehealth: Payer: Self-pay | Admitting: Family Medicine

## 2016-11-10 NOTE — Telephone Encounter (Signed)
Relation to QH:KUVJ Call back number:[phone]   Reason for call:  Patient requesting 09/01/16  PPD skin test record for school, please place at front desk, please advise when ready to pick up, will notify patient.

## 2016-11-10 NOTE — Telephone Encounter (Signed)
Printed requested results/patient notified ready for pickup at the front desk.

## 2016-12-05 ENCOUNTER — Ambulatory Visit: Payer: BLUE CROSS/BLUE SHIELD | Admitting: Nurse Practitioner

## 2017-01-25 ENCOUNTER — Telehealth: Payer: Self-pay | Admitting: Family Medicine

## 2017-01-25 NOTE — Telephone Encounter (Signed)
Caller name: Nataley  Relation to pt: self  Call back number: [phone] Pharmacy:  Reason for call: Pt is stating is needing copy of her TB test reading. Pt is needing it for school and would like to pick up the copy for today 01-25-2017 in the afternoon. Please advise ASAP.

## 2017-01-26 ENCOUNTER — Other Ambulatory Visit: Payer: Self-pay | Admitting: Family Medicine

## 2017-01-26 NOTE — Telephone Encounter (Signed)
Printed off the visit from 09/01/16 which was the last TB test.   Left form in the front office   PC

## 2017-02-07 ENCOUNTER — Encounter: Payer: Self-pay | Admitting: Certified Nurse Midwife

## 2017-02-07 ENCOUNTER — Ambulatory Visit (INDEPENDENT_AMBULATORY_CARE_PROVIDER_SITE_OTHER): Payer: BLUE CROSS/BLUE SHIELD | Admitting: Certified Nurse Midwife

## 2017-02-07 ENCOUNTER — Other Ambulatory Visit (HOSPITAL_COMMUNITY)
Admission: RE | Admit: 2017-02-07 | Discharge: 2017-02-07 | Disposition: A | Discharge status: Home / Self Care | Payer: BLUE CROSS/BLUE SHIELD | Source: Ambulatory Visit | Attending: Certified Nurse Midwife | Admitting: Certified Nurse Midwife

## 2017-02-07 VITALS — BP 120/70 | Ht 63.5 in | Wt 167.0 lb

## 2017-02-07 DIAGNOSIS — N898 Other specified noninflammatory disorders of vagina: Secondary | ICD-10-CM

## 2017-02-07 DIAGNOSIS — N951 Menopausal and female climacteric states: Secondary | ICD-10-CM | POA: Diagnosis not present

## 2017-02-07 DIAGNOSIS — Z01419 Encounter for gynecological examination (general) (routine) without abnormal findings: Secondary | ICD-10-CM | POA: Diagnosis not present

## 2017-02-07 DIAGNOSIS — Z124 Encounter for screening for malignant neoplasm of cervix: Secondary | ICD-10-CM | POA: Diagnosis not present

## 2017-02-07 NOTE — Progress Notes (Signed)
56 y.o. G1P0010 Legally Separated  Caucasian Fe here for annual exam. Post Menopausal no HRT, denies vaginal bleeding or vaginal dryness. Not sexually active. No STD concerns or testing needed. Some vaginal itching off and on, please check for infection. Denies new personal products or increase in discharge or odor. Occasional loss of urinary with sneeze, no issues. Busy with animal rescue and many cats. Has noted many flea bites and scratches handling this animals. 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 Sees Vivien Rossetti PCP for aex, labs, diabetes, hypothyroid, hypertension, cholesterol management. Working on eating better to control blood sugar. No other health issues today. Planning to decrease her rescue animals, "just too much".   Patient's last menstrual period was 02/15/2012.          Sexually active: No.  The current method of family planning is post menopausal status.    Exercising: No.  exercise Smoker:  no  Health Maintenance: Pap:  11-06-12 neg HPV HR neg, 12-01-15 neg HPV HR+ 16,18/45 neg, previous cryo History of Abnormal Pap: yes MMG:  12-23-15 category c density birads 2:neg,  previous rt breast u/s done Self Breast exams: no Colonoscopy:  2013 polyp f/u 51yrs declines scheduling at this point, but will call when able to schedule BMD:   none TDaP:  2016 Shingles: no Pneumonia: 2017 Hep C and HIV: HIV neg 2016 Labs: if needed.   reports that she has never smoked. She has never used smokeless tobacco. She reports that she drinks alcohol. She reports that she does not use drugs.  Past Medical History:  Diagnosis Date  . Anemia 06/10/2013  . Asthma   . Depression   . Diabetes mellitus    Type II  uncontrolled  . Dysplasia of cervix, low grade (CIN 1) 1992   Cryo   . HPV test positive, pap was normal 12/01/2015   genotype # 16/18/45 was negative.  Repeat pap 2018  . Hyperlipidemia   . Hypertension   .  Hypothyroidism    s/p thyroidectomy for goiter  . Low back pain 03/07/2016  . Personal history of colonic polyps 11/20/2012   Follows with [facility] [place]  . Preventative health care 11/20/2012  . TMJ dysfunction 06/10/2013    Past Surgical History:  Procedure Laterality Date  . CHOLECYSTECTOMY  05-2004  . IUD REMOVAL  07/20/2008   Of embedded paraguard  . LAPAROSCOPIC CHOLECYSTECTOMY  2006  . THYROIDECTOMY  09/1994  . TONSILLECTOMY AND ADENOIDECTOMY      Current Outpatient Prescriptions  Medication Sig Dispense Refill  . aspirin 81 MG tablet Take 81 mg by mouth daily.      . Aspirin-Acetaminophen-Caffeine (EXCEDRIN PO) Take by mouth as needed.    . Biotin 10 MG TABS Take 1 tablet by mouth every morning.    Marland Kitchen BLACK COHOSH PO Take by mouth.    . cetirizine (ZYRTEC) 10 MG tablet Take 1 tablet by mouth 1-2 times a week.     . ferrous sulfate 325 (65 FE) MG tablet Take 325 mg by mouth every evening.    Marland Kitchen glucose blood (ACCU-CHEK AVIVA) test strip Check BS bid and prn.  DX: 250.02 100 each 3  . glucose blood test strip 1 each by Other route 2 (two) times daily as needed for other. Use as instructed 100 each 12  . ibuprofen (ADVIL,MOTRIN) 200 MG tablet Take 2-3 tablets by mouth up to 3 times a day as  needed for pain.     . Insulin Pen Needle (B-D ULTRAFINE III SHORT PEN) 31G X 8 MM MISC As directed. 100 each 3  . levothyroxine (SYNTHROID, LEVOTHROID) 200 MCG tablet TAKE ONE TABLET BY MOUTH ONCE DAILY BEFORE BREAKFAST 90 tablet 2  . Liraglutide 18 MG/3ML SOPN Inject 1 mL (6 mg total) into the skin daily. 6 mL   . lisinopril (PRINIVIL,ZESTRIL) 5 MG tablet Take 1 tablet (5 mg total) by mouth daily. 90 tablet 2  . MELATONIN PO Take by mouth.    . metFORMIN (GLUCOPHAGE) 1000 MG tablet TAKE 1 TABLET BY MOUTH TWICE DAILY WITH MEALS 180 tablet 0  . Potassium Gluconate 595 MG TABS Take 2 tablets by mouth daily.     . rosuvastatin (CRESTOR) 20 MG tablet Take 1 tablet (20 mg  total) by mouth daily. 90 tablet 2  . UNABLE TO FIND Calcium, mag, zinc takes 4 a day    . venlafaxine XR (EFFEXOR XR) 75 MG 24 hr capsule Take 1 capsule (75 mg total) by mouth 3 (three) times daily. 270 capsule 1   No current facility-administered medications for this visit.     Family History  Problem Relation Age of Onset  . Cancer Mother        ?uterine  . Diabetes Father   . Thyroid disease Father   . Stroke Father   . Hypertension Sister   . Diabetes Sister   . Heart failure Maternal Grandfather   . Diabetes Paternal Grandmother   . Heart failure Paternal Grandfather   . Thyroid disease Sister     ROS:  Pertinent items are noted in HPI.  Otherwise, a comprehensive ROS was negative.  Exam:   BP 120/70   Ht 5' 3.5" (1.613 m)   Wt 167 lb (75.8 kg)   LMP 02/15/2012   BMI 29.12 kg/m  Height: 5' 3.5" (161.3 cm) Ht Readings from Last 3 Encounters:  02/07/17 5' 3.5" (1.613 m)  03/07/16 5\' 4"  (1.626 m)  12/01/15 5' 3.5" (1.613 m)    General appearance: alert, cooperative and appears stated age Head: Normocephalic, without obvious abnormality, atraumatic Neck: no adenopathy, supple, symmetrical, trachea midline and thyroid normal to inspection and palpation Lungs: clear to auscultation bilaterally Breasts: normal appearance, no masses or tenderness, No nipple retraction or dimpling, No nipple discharge or bleeding, No axillary or supraclavicular adenopathy Heart: regular rate and rhythm Abdomen: soft, non-tender; no masses,  no organomegaly Extremities: extremities normal, atraumatic, no cyanosis or edema Skin: Skin color, texture, turgor normal. No rashes or lesions. Many bites on arms and legs, appear to be flea bites, no infection noted. Overall cleanliness fair. Lymph nodes: Cervical, supraclavicular, and axillary nodes normal. No abnormal inguinal nodes palpated Neurologic: Grossly normal   Pelvic: External genitalia:  no lesions              Urethra:  normal  appearing urethra with no masses, tenderness or lesions              Bartholin's and Skene's: normal                 Vagina: normal appearing vagina with normal color and discharge, no lesions              Cervix: no cervical motion tenderness and no lesions              Pap taken: Yes.   Bimanual Exam:  Uterus:  normal size, contour, position, consistency, mobility, non-tender  Adnexa: normal adnexa and no mass, fullness, tenderness               Rectovaginal: Confirms               Anus:  normal sphincter tone, no lesions  Chaperone present: yes  A:  Well Woman with normal exam  Menopausal no HRT  Vaginal irritation and redness ,R/O vaginal infection vs vaginal dryness and hygiene  Hypertension,cholesterol, diabetes,hypothyroid with PCP management  Mammogram over due  Colonoscopy due history of polyps    P:   Reviewed health and wellness pertinent to exam  Discussed importance of notifying if vaginal bleeding.  Discussed comfort measures of baking soda or Aveeno bath for vaginal irritation. Discussed coconut oil use for vaginal dryness which can increase risk of vaginal infections. Questions addressed.  Lab Affirm will treat if indicated  Continue follow up with PCP as indicated  Patient plans to schedule  Patient declines scheduling today and will call if needs help with  Pap smear: yes   counseled on breast self exam, mammography screening, STD prevention, HIV risk factors and prevention, feminine hygiene, adequate intake of calcium and vitamin D, diet and exercise  return annually or prn  An After Visit Summary was printed and given to the patient.

## 2017-02-07 NOTE — Patient Instructions (Signed)

## 2017-02-08 ENCOUNTER — Ambulatory Visit: Payer: BLUE CROSS/BLUE SHIELD | Admitting: Obstetrics and Gynecology

## 2017-02-08 LAB — VAGINITIS/VAGINOSIS, DNA PROBE
Candida Species: NEGATIVE
Gardnerella vaginalis: NEGATIVE
Trichomonas vaginosis: NEGATIVE

## 2017-02-09 LAB — CYTOLOGY - PAP
Adequacy: ABSENT
Diagnosis: NEGATIVE
HPV (WINDOPATH): NOT DETECTED

## 2017-04-20 ENCOUNTER — Other Ambulatory Visit: Payer: Self-pay | Admitting: Family Medicine

## 2017-04-20 DIAGNOSIS — D649 Anemia, unspecified: Secondary | ICD-10-CM

## 2017-04-20 DIAGNOSIS — R7401 Elevation of levels of liver transaminase levels: Secondary | ICD-10-CM

## 2017-04-20 DIAGNOSIS — E1165 Type 2 diabetes mellitus with hyperglycemia: Secondary | ICD-10-CM

## 2017-04-20 DIAGNOSIS — R740 Nonspecific elevation of levels of transaminase and lactic acid dehydrogenase [LDH]: Secondary | ICD-10-CM

## 2017-04-20 DIAGNOSIS — E118 Type 2 diabetes mellitus with unspecified complications: Secondary | ICD-10-CM

## 2017-04-20 DIAGNOSIS — R7402 Elevation of levels of lactic acid dehydrogenase (LDH): Secondary | ICD-10-CM

## 2017-04-20 DIAGNOSIS — E890 Postprocedural hypothyroidism: Secondary | ICD-10-CM

## 2017-04-20 DIAGNOSIS — I10 Essential (primary) hypertension: Secondary | ICD-10-CM

## 2017-04-20 DIAGNOSIS — IMO0002 Reserved for concepts with insufficient information to code with codable children: Secondary | ICD-10-CM

## 2017-05-02 DIAGNOSIS — Z86010 Personal history of colonic polyps: Secondary | ICD-10-CM | POA: Diagnosis not present

## 2017-10-03 ENCOUNTER — Other Ambulatory Visit: Payer: Self-pay | Admitting: Obstetrics and Gynecology

## 2017-10-03 DIAGNOSIS — Z1231 Encounter for screening mammogram for malignant neoplasm of breast: Secondary | ICD-10-CM

## 2017-10-05 ENCOUNTER — Telehealth: Payer: Self-pay | Admitting: Obstetrics and Gynecology

## 2017-10-05 NOTE — Telephone Encounter (Signed)
Patient found a "small breast lump" and would like to schedule a  3D Mammogram.

## 2017-10-05 NOTE — Telephone Encounter (Signed)
Left message to call Kaitlyn at [phone]. 

## 2017-10-08 NOTE — Telephone Encounter (Signed)
The patient is returning a call to [place].

## 2017-10-08 NOTE — Telephone Encounter (Signed)
Spoke with patient. Patient states that she found a mass in her left breast. Breast mass is small per patient. Patient is very concerned and would like to be seen as soon as possible. Appointment scheduled for tomorrow at 8:45 am with Dr.Silva. [place] is agreeable to date and time.  Dr.Silva, okay time for scheduling?

## 2017-10-08 NOTE — Telephone Encounter (Signed)
Left message to call [facility] at [phone].  Patient needs an appointment for breast check.

## 2017-10-08 NOTE — Telephone Encounter (Signed)
Please have patient keep her appointment.

## 2017-10-09 ENCOUNTER — Ambulatory Visit: Payer: BLUE CROSS/BLUE SHIELD | Admitting: Obstetrics and Gynecology

## 2017-10-09 ENCOUNTER — Encounter: Payer: Self-pay | Admitting: Obstetrics and Gynecology

## 2017-10-09 ENCOUNTER — Other Ambulatory Visit: Payer: Self-pay

## 2017-10-09 VITALS — BP 120/68 | HR 84 | Resp 16 | Ht 63.5 in | Wt 165.0 lb

## 2017-10-09 DIAGNOSIS — N6320 Unspecified lump in the left breast, unspecified quadrant: Secondary | ICD-10-CM

## 2017-10-09 NOTE — Progress Notes (Signed)
Patient scheduled while in office for bilateral Dx MMG and left breast US, if needed. Scheduled at [place] on 10/10/17 arriving at 10:50 am for 11:10am appt. Patient verbalizes understanding and is agreeable.
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
## 2017-10-10 ENCOUNTER — Other Ambulatory Visit: Payer: Self-pay | Admitting: Obstetrics and Gynecology

## 2017-10-10 ENCOUNTER — Ambulatory Visit
Admission: RE | Admit: 2017-10-10 | Discharge: 2017-10-10 | Disposition: A | Discharge status: Home / Self Care | Payer: BLUE CROSS/BLUE SHIELD | Source: Ambulatory Visit | Attending: Obstetrics and Gynecology | Admitting: Obstetrics and Gynecology

## 2017-10-10 DIAGNOSIS — N6320 Unspecified lump in the left breast, unspecified quadrant: Secondary | ICD-10-CM

## 2017-10-10 DIAGNOSIS — R922 Inconclusive mammogram: Secondary | ICD-10-CM | POA: Diagnosis not present

## 2017-10-10 DIAGNOSIS — N6002 Solitary cyst of left breast: Secondary | ICD-10-CM | POA: Diagnosis not present

## 2017-10-19 ENCOUNTER — Telehealth: Payer: Self-pay | Admitting: Obstetrics and Gynecology

## 2017-10-19 NOTE — Telephone Encounter (Signed)
Patient left message on machine at lunch time that she is returning a call to [place].

## 2017-10-19 NOTE — Telephone Encounter (Signed)
Left message to call [place] at [phone].  Notes recorded by Sprague, Harley Hallmark, RN on 10/19/2017 at 11:08 AM EDT Left message to call Kaitlyn at [phone]. ------  Notes recorded by Gwendlyn Deutscher, RN on 10/11/2017 at 9:54 AM EDT Patient removed from mammogram hold. Left message to call Kaitlyn at [phone]. ------  Notes recorded by Nunzio Cobbs, MD on 10/11/2017 at 5:49 AM EDT Please remove from mammogram hold and return to routine screening.  Please contact patient and cancel breast recheck for July.  This is not needed.

## 2017-10-25 NOTE — Telephone Encounter (Signed)
Left detailed message at number provided [phone], okay per ROI. Advised of message as seen below from [place]. Advised recheck appointment for July has been cancelled and no longer needed due to normal results on mammogram. Advised to return call with any additional questions.  Routing to provider for final review. Patient agreeable to disposition. Will close encounter.

## 2017-11-19 ENCOUNTER — Other Ambulatory Visit: Payer: Self-pay | Admitting: Family Medicine

## 2017-11-21 ENCOUNTER — Ambulatory Visit: Payer: BLUE CROSS/BLUE SHIELD | Admitting: Obstetrics and Gynecology

## 2018-02-07 NOTE — Progress Notes (Signed)
57 y.o. G1P0010 Legally Separated Caucasian female here for annual exam.   Seen for left breast lump 3 - 4 mm at 12:00 on 10/09/17.  Had slight retraction of the skin. Dx imaging showed a benign oil cyst.  Pain with intercourse and decreased interest.  Not using any lubricants.  Has coconut oil but has not used yet.   No vaginal bleeding.   No new partner.  Declines STD testing.   Weight loss.  Not having time to eat.  Working full time.  Feels great. Does have increased palpitations from her baseline.  Drinking tea. She will see her PCP for this.  States her A1C is about 8.   Taking calcium and vit D daily.   Did the Breast Cancer Walk.  PCP: Penni Homans   Patient's last menstrual period was 02/15/2012.           Sexually active: Yes The current method of family planning is post menopausal status.    Exercising: Yes.    walking   Smoker:  no  Health Maintenance: Pap:  02/07/2017 Pap smear reviewed negative HPVHR not detected   History of abnormal Pap:  Yes 12-01-15 neg HPV HR+ 16,18/45 neg, previous cryo. MMG:  10/10/2017 BI-RADS CATEGORY  2: Benign. Colonoscopy:  04/2017 neg;due 5 years BMD:   n/a  Result  n/a TDaP:  2016 Gardasil:   no [id number] 2016 Hep C:  She will check. Screening Labs:  PCP.   reports that she has never smoked. She has never used smokeless tobacco. She reports that she drinks alcohol. She reports that she does not use drugs.  Past Medical History:  Diagnosis Date  . Anemia 06/10/2013  . Asthma   . Depression   . Diabetes mellitus    Type II  uncontrolled  . Dysplasia of cervix, low grade (CIN 1) 1992   Cryo   . HPV test positive, pap was normal 12/01/2015   genotype # 16/18/45 was negative.  Repeat pap 2018  . Hyperlipidemia   . Hypertension   . Hypothyroidism    s/p thyroidectomy for goiter  . Low back pain 03/07/2016  . 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 Personal history of colonic polyps 11/20/2012   Follows with [facility]  in [place]  . Preventative health care 11/20/2012  . TMJ dysfunction 06/10/2013    Past Surgical History:  Procedure Laterality Date  . CHOLECYSTECTOMY  05-2004  . IUD REMOVAL  07/20/2008   Of embedded paraguard  . LAPAROSCOPIC CHOLECYSTECTOMY  2006  . THYROIDECTOMY  09/1994  . TONSILLECTOMY AND ADENOIDECTOMY      Current Outpatient Medications  Medication Sig Dispense Refill  . potassium chloride (K-DUR,KLOR-CON) 10 MEQ tablet Take 10 mEq by mouth 2 (two) times daily.    Marland Kitchen aspirin 81 MG tablet Take 81 mg by mouth daily.      . Aspirin-Acetaminophen-Caffeine (EXCEDRIN PO) Take by mouth as needed.    . Biotin 10 MG TABS Take 1 tablet by mouth every morning.    Marland Kitchen BLACK COHOSH PO Take by mouth.    . cetirizine (ZYRTEC) 10 MG tablet Take 1 tablet by mouth 1-2 times a week.     . ferrous sulfate 325 (65 FE) MG tablet Take 325 mg by mouth every evening.    Marland Kitchen glucose blood (ACCU-CHEK AVIVA) test strip Check BS bid and prn.  DX: 250.02 100 each 3  . glucose blood test strip 1 each by Other route 2 (two) times daily as  needed for other. Use as instructed 100 each 12  . ibuprofen (ADVIL,MOTRIN) 200 MG tablet Take 2-3 tablets by mouth up to 3 times a day as needed for pain.     . Insulin Pen Needle (B-D ULTRAFINE III SHORT PEN) 31G X 8 MM MISC As directed. 100 each 3  . levothyroxine (SYNTHROID, LEVOTHROID) 200 MCG tablet TAKE ONE TABLET BY MOUTH ONCE DAILY BEFORE BREAKFAST 90 tablet 2  . Liraglutide 18 MG/3ML SOPN Inject 1 mL (6 mg total) into the skin daily. 6 mL   . lisinopril (PRINIVIL,ZESTRIL) 5 MG tablet TAKE ONE TABLET BY MOUTH ONCE DAILY 90 tablet 2  . MELATONIN PO Take by mouth.    . metFORMIN (GLUCOPHAGE) 1000 MG tablet TAKE 1 TABLET BY MOUTH TWICE DAILY WITH MEALS 180 tablet 0  . metFORMIN (GLUCOPHAGE) 1000 MG tablet TAKE 1 TABLET BY MOUTH TWICE DAILY WITH MEALS 180 tablet 0  . rosuvastatin (CRESTOR) 20 MG tablet TAKE ONE TABLET BY MOUTH ONCE DAILY 90 tablet 2  . UNABLE TO FIND  Calcium, mag, zinc takes 4 a day    . venlafaxine XR (EFFEXOR XR) 75 MG 24 hr capsule Take 1 capsule (75 mg total) by mouth 3 (three) times daily. 270 capsule 1  . venlafaxine XR (EFFEXOR-XR) 75 MG 24 hr capsule TAKE 1 CAPSULE BY MOUTH THREE TIMES DAILY 270 capsule 1   No current facility-administered medications for this visit.     Family History  Problem Relation Age of Onset  . Cancer Mother        ?uterine  . Diabetes Father   . Thyroid disease Father   . Stroke Father   . Hypertension Sister   . Diabetes Sister   . Heart failure Maternal Grandfather   . Diabetes Paternal Grandmother   . Heart failure Paternal Grandfather   . Thyroid disease Sister   . Breast cancer Neg Hx     Review of Systems  Constitutional: Negative.   HENT: Negative.   Eyes: Negative.   Respiratory: Negative.   Cardiovascular: Positive for palpitations.  Gastrointestinal: Negative.   Endocrine: Negative.   Genitourinary: Negative.   Musculoskeletal: Negative.   Skin: Negative.   Allergic/Immunologic: Negative.   Neurological: Negative.   Hematological: Negative.   Psychiatric/Behavioral: Negative.   All other systems reviewed and are negative.   Exam:   BP 128/60 (BP Location: Right Arm, Patient Position: Sitting, Cuff Size: Normal)   Pulse 90   Resp 16   Ht 5' 3.5" (1.613 m)   Wt 163 lb (73.9 kg)   LMP 02/15/2012   BMI 28.42 kg/m     General appearance: alert, cooperative and appears stated age Head: Normocephalic, without obvious abnormality, atraumatic Neck: no adenopathy, supple, symmetrical, trachea midline and thyroid normal to inspection and palpation Lungs: clear to auscultation bilaterally Breasts: normal appearance, no masses or tenderness, No nipple retraction or dimpling, No nipple discharge or bleeding, No axillary or supraclavicular adenopathy Heart: regular rate and rhythm Abdomen: soft, non-tender; no masses, no organomegaly Extremities: extremities normal, atraumatic,  no cyanosis or edema Skin: Skin color, texture, turgor normal. No rashes or lesions Lymph nodes: Cervical, supraclavicular, and axillary nodes normal. No abnormal inguinal nodes palpated Neurologic: Grossly normal  Pelvic: External genitalia:  no lesions              Urethra:  normal appearing urethra with no masses, tenderness or lesions              Bartholins and Skenes:  normal                 Vagina: normal appearing vagina with normal color and discharge, no lesions              Cervix: no lesions              Pap taken: Yes.   Bimanual Exam:  Uterus:  normal size, contour, position, consistency, mobility, non-tender              Adnexa: no mass, fullness, tenderness              Rectal exam: Yes.  .  Confirms.              Anus:  normal sphincter tone, no lesions  Chaperone was present for exam.  Assessment:   Well woman visit with normal exam. Hx positive HR HPV with neg 16/18/45 2017.   Hx prior LGSIL. Weight loss and palpitations.   Plan: Mammogram screening. Recommended self breast awareness. Pap and HR HPV as above. Guidelines for Calcium, Vitamin D, regular exercise program including cardiovascular and weight bearing exercise. Try vit E suppository for the vagina.  I did mention vaginal estrogen, and she will consider this.  She will follow up with her PCP. Follow up annually and prn.     After visit summary provided.

## 2018-02-11 ENCOUNTER — Ambulatory Visit: Payer: BLUE CROSS/BLUE SHIELD | Admitting: Obstetrics and Gynecology

## 2018-02-11 ENCOUNTER — Other Ambulatory Visit: Payer: Self-pay

## 2018-02-11 ENCOUNTER — Other Ambulatory Visit (HOSPITAL_COMMUNITY)
Admission: RE | Admit: 2018-02-11 | Discharge: 2018-02-11 | Disposition: A | Discharge status: Home / Self Care | Payer: BLUE CROSS/BLUE SHIELD | Source: Ambulatory Visit | Attending: Obstetrics and Gynecology | Admitting: Obstetrics and Gynecology

## 2018-02-11 ENCOUNTER — Encounter: Payer: Self-pay | Admitting: Obstetrics and Gynecology

## 2018-02-11 VITALS — BP 128/60 | HR 90 | Resp 16 | Ht 63.5 in | Wt 163.0 lb

## 2018-02-11 DIAGNOSIS — Z01419 Encounter for gynecological examination (general) (routine) without abnormal findings: Secondary | ICD-10-CM | POA: Diagnosis not present

## 2018-02-11 NOTE — Patient Instructions (Signed)

## 2018-02-12 LAB — CYTOLOGY - PAP
Diagnosis: NEGATIVE
HPV: NOT DETECTED

## 2018-02-14 ENCOUNTER — Ambulatory Visit: Payer: BLUE CROSS/BLUE SHIELD | Admitting: Certified Nurse Midwife

## 2018-03-26 ENCOUNTER — Other Ambulatory Visit: Payer: Self-pay | Admitting: Family Medicine

## 2018-03-26 DIAGNOSIS — IMO0002 Reserved for concepts with insufficient information to code with codable children: Secondary | ICD-10-CM

## 2018-03-26 DIAGNOSIS — I10 Essential (primary) hypertension: Secondary | ICD-10-CM

## 2018-03-26 DIAGNOSIS — E118 Type 2 diabetes mellitus with unspecified complications: Secondary | ICD-10-CM

## 2018-03-26 DIAGNOSIS — E1165 Type 2 diabetes mellitus with hyperglycemia: Secondary | ICD-10-CM

## 2018-03-26 DIAGNOSIS — E890 Postprocedural hypothyroidism: Secondary | ICD-10-CM

## 2018-03-26 DIAGNOSIS — R7401 Elevation of levels of liver transaminase levels: Secondary | ICD-10-CM

## 2018-03-26 DIAGNOSIS — D649 Anemia, unspecified: Secondary | ICD-10-CM

## 2018-03-26 DIAGNOSIS — R740 Nonspecific elevation of levels of transaminase and lactic acid dehydrogenase [LDH]: Secondary | ICD-10-CM

## 2018-07-11 ENCOUNTER — Other Ambulatory Visit: Payer: Self-pay | Admitting: Family Medicine

## 2019-02-04 ENCOUNTER — Other Ambulatory Visit: Payer: Self-pay | Admitting: Family Medicine

## 2019-02-04 DIAGNOSIS — E1165 Type 2 diabetes mellitus with hyperglycemia: Secondary | ICD-10-CM

## 2019-02-04 DIAGNOSIS — R7402 Elevation of levels of lactic acid dehydrogenase (LDH): Secondary | ICD-10-CM

## 2019-02-04 DIAGNOSIS — R7401 Elevation of levels of liver transaminase levels: Secondary | ICD-10-CM

## 2019-02-04 DIAGNOSIS — I10 Essential (primary) hypertension: Secondary | ICD-10-CM

## 2019-02-04 DIAGNOSIS — D649 Anemia, unspecified: Secondary | ICD-10-CM

## 2019-02-04 DIAGNOSIS — E890 Postprocedural hypothyroidism: Secondary | ICD-10-CM

## 2019-02-04 DIAGNOSIS — IMO0002 Reserved for concepts with insufficient information to code with codable children: Secondary | ICD-10-CM

## 2019-02-11 ENCOUNTER — Other Ambulatory Visit: Payer: Self-pay | Admitting: Obstetrics and Gynecology

## 2019-02-11 DIAGNOSIS — Z1231 Encounter for screening mammogram for malignant neoplasm of breast: Secondary | ICD-10-CM

## 2019-02-12 ENCOUNTER — Other Ambulatory Visit: Payer: Self-pay

## 2019-02-14 ENCOUNTER — Encounter: Payer: Self-pay | Admitting: Obstetrics and Gynecology

## 2019-02-14 ENCOUNTER — Ambulatory Visit: Payer: BC Managed Care – PPO | Admitting: Obstetrics and Gynecology

## 2019-02-14 ENCOUNTER — Other Ambulatory Visit: Payer: Self-pay

## 2019-02-14 VITALS — BP 132/74 | HR 92 | Temp 97.1°F | Resp 12 | Ht 63.25 in | Wt 152.0 lb

## 2019-02-14 DIAGNOSIS — Z23 Encounter for immunization: Secondary | ICD-10-CM | POA: Diagnosis not present

## 2019-02-14 DIAGNOSIS — Z01419 Encounter for gynecological examination (general) (routine) without abnormal findings: Secondary | ICD-10-CM

## 2019-02-14 NOTE — Progress Notes (Signed)
58 y.o. G1P0010 Legally Separated Caucasian female here for annual exam.    No vaginal bleeding or spotting.   States her blood sugar has been elevated.  Last A1C is 9.3 on chart review in Care Everywhere.  Working for [employer] now.   PCP: Penni Homans, MD     Patient's last menstrual period was 02/15/2012.           Sexually active: No.  The current method of family planning is post menopausal status.    Exercising: Yes.    The patient has a physically strenuous job, but has no regular exercise apart from work.  Smoker:  no  Health Maintenance: Pap:  02/07/2017 Pap smear reviewed negative HPVHR not detected.  02/11/18 normal, negatve HR HPV History of abnormal Pap:  Yes 12-01-15 neg HPV HR+ 16,18/45 neg, previous cryo. MMG:  10/10/17 BIRADS 2 benign/density c Colonoscopy:  04/2017 neg;due 5 years BMD:   n/a  Result  n/a TDaP:  2016 Gardasil:   no HIV: negative in the past Hep C: unsure Screening Labs:  PCP. Flu vaccine:  Wants today.   reports that she has never smoked. She has never used smokeless tobacco. She reports current alcohol use. She reports that she does not use drugs.  Past Medical History:  Diagnosis Date  . Anemia 06/10/2013  . Asthma   . Depression   . Diabetes mellitus    Type II  uncontrolled  . Dysplasia of cervix, low grade (CIN 1) 1992   Cryo   . HPV test positive, pap was normal 12/01/2015   genotype # 16/18/45 was negative.  Repeat pap 2018  . Hyperlipidemia   . Hypertension   . Hypothyroidism    s/p thyroidectomy for goiter  . Low back pain 03/07/2016  . 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 Personal history of colonic polyps 11/20/2012   Follows with [facility] in [place]  . Preventative health care 11/20/2012  . TMJ dysfunction 06/10/2013    Past Surgical History:  Procedure Laterality Date  . CHOLECYSTECTOMY  05-2004  . IUD REMOVAL  07/20/2008   Of embedded paraguard  . LAPAROSCOPIC CHOLECYSTECTOMY  2006  . THYROIDECTOMY  09/1994  .  TONSILLECTOMY AND ADENOIDECTOMY      Current Outpatient Medications  Medication Sig Dispense Refill  . aspirin 81 MG tablet Take 81 mg by mouth daily.      . Aspirin-Acetaminophen-Caffeine (EXCEDRIN PO) Take by mouth as needed.    Marland Kitchen BLACK COHOSH PO Take by mouth.    . cetirizine (ZYRTEC) 10 MG tablet Take 1 tablet by mouth 1-2 times a week.     . EUTHYROX 200 MCG tablet TAKE 1 TABLET BY MOUTH ONCE DAILY BEFORE BREAKFAST 90 tablet 0  . ferrous sulfate 325 (65 FE) MG tablet Take 325 mg by mouth every evening.    Marland Kitchen glucose blood (ACCU-CHEK AVIVA) test strip Check BS bid and prn.  DX: 250.02 100 each 3  . glucose blood test strip 1 each by Other route 2 (two) times daily as needed for other. Use as instructed 100 each 12  . ibuprofen (ADVIL,MOTRIN) 200 MG tablet Take 2-3 tablets by mouth up to 3 times a day as needed for pain.     . Insulin Pen Needle (B-D ULTRAFINE III SHORT PEN) 31G X 8 MM MISC As directed. 100 each 3  . lisinopril (PRINIVIL,ZESTRIL) 5 MG tablet TAKE 1 TABLET BY MOUTH ONCE DAILY 90 tablet 2  . MELATONIN PO Take by mouth.    Marland Kitchen  metFORMIN (GLUCOPHAGE) 1000 MG tablet TAKE 1 TABLET BY MOUTH TWICE DAILY WITH MEALS 180 tablet 0  . potassium chloride (K-DUR,KLOR-CON) 10 MEQ tablet Take 10 mEq by mouth 2 (two) times daily.    . rosuvastatin (CRESTOR) 20 MG tablet Take 1 tablet by mouth once daily 90 tablet 0  . UNABLE TO FIND Calcium, mag, zinc takes 4 a day    . venlafaxine XR (EFFEXOR XR) 75 MG 24 hr capsule Take 1 capsule (75 mg total) by mouth 3 (three) times daily. 270 capsule 1  . Biotin 10 MG TABS Take 1 tablet by mouth every morning.    . Liraglutide 18 MG/3ML SOPN Inject 1 mL (6 mg total) into the skin daily. (Patient not taking: Reported on 02/14/2019) 6 mL    No current facility-administered medications for this visit.     Family History  Problem Relation Age of Onset  . Cancer Mother        ?uterine  . Diabetes Father   . Thyroid disease Father   . Stroke Father    . Hypertension Sister   . Diabetes Sister   . Heart failure Maternal Grandfather   . Diabetes Paternal Grandmother   . Heart failure Paternal Grandfather   . Thyroid disease Sister   . Breast cancer Neg Hx     Review of Systems  Constitutional: Negative.   HENT: Negative.   Eyes: Negative.   Respiratory: Negative.   Cardiovascular: Negative.   Gastrointestinal: Negative.   Endocrine: Negative.   Genitourinary: Negative.   Musculoskeletal: Negative.   Skin: Negative.   Allergic/Immunologic: Negative.   Neurological: Negative.   Hematological: Negative.   Psychiatric/Behavioral: Negative.     Exam:   BP 132/74 (BP Location: Right Arm, Patient Position: Sitting, Cuff Size: Normal)   Pulse 92   Temp (!) 97.1 F (36.2 C) (Temporal)   Resp 12   Ht 5' 3.25" (1.607 m)   Wt 152 lb (68.9 kg)   LMP 02/15/2012   BMI 26.71 kg/m     General appearance: alert, cooperative and appears stated age Head: normocephalic, without obvious abnormality, atraumatic Neck: no adenopathy, supple, symmetrical, trachea midline and thyroid normal to inspection and palpation Lungs: clear to auscultation bilaterally Breasts: right - normal appearance, no masses or tenderness, No nipple retraction or dimpling, No nipple discharge or bleeding, No axillary adenopathy Left - normal appearance,  3 mm firm mass at 12:00 just above areola.  No tenderness.  No nipple retraction or dimpling, No nipple discharge or bleeding, No axillary adenopathy Heart: regular rate and rhythm Abdomen: soft, non-tender; no masses, no organomegaly Extremities: extremities normal, atraumatic, no cyanosis or edema Skin: skin color, texture, turgor normal. No rashes or lesions Lymph nodes: cervical, supraclavicular, and axillary nodes normal. Neurologic: grossly normal  Pelvic: External genitalia:  no lesions              No abnormal inguinal nodes palpated.              Urethra:  normal appearing urethra with no masses,  tenderness or lesions              Bartholins and Skenes: normal                 Vagina: normal appearing vagina with normal color and discharge, no lesions              Cervix: no lesions  Pap taken: No. Bimanual Exam:  Uterus:  normal size, contour, position, consistency, mobility, non-tender              Adnexa: no mass, fullness, tenderness              Rectal exam: Yes.  .  Confirms.              Anus:  normal sphincter tone, no lesions  Chaperone was present for exam.  Assessment:   Well woman visit with normal exam. Hx positive HR HPV with neg 16/18/45 2017.   Hx remote LGSIL. Left breast nodule.  Oil cyst of skin by prior imaging.  Vulvitis.  She will use Monistat.  Plan: Mammogram screening discussed.  Appt on Oct. 22.  Ok for screening mammogram.  Self breast awareness reviewed. Pap and HR HPV in 2022.  3 year CIN 3 risk is 0.29%.  I discussed pap guidelines with patient.  Guidelines for Calcium, Vitamin D, regular exercise program including cardiovascular and weight bearing exercise. Flu vaccine today.  Follow up annually and prn.   After visit summary provided.

## 2019-02-14 NOTE — Patient Instructions (Signed)

## 2019-02-21 ENCOUNTER — Ambulatory Visit: Payer: BLUE CROSS/BLUE SHIELD

## 2019-02-27 ENCOUNTER — Ambulatory Visit: Payer: BLUE CROSS/BLUE SHIELD

## 2019-06-25 ENCOUNTER — Other Ambulatory Visit: Payer: Self-pay | Admitting: Family Medicine

## 2019-06-25 DIAGNOSIS — I10 Essential (primary) hypertension: Secondary | ICD-10-CM

## 2019-06-25 DIAGNOSIS — IMO0002 Reserved for concepts with insufficient information to code with codable children: Secondary | ICD-10-CM

## 2019-06-25 DIAGNOSIS — R7401 Elevation of levels of liver transaminase levels: Secondary | ICD-10-CM

## 2019-06-25 DIAGNOSIS — E890 Postprocedural hypothyroidism: Secondary | ICD-10-CM

## 2019-06-25 DIAGNOSIS — R7402 Elevation of levels of lactic acid dehydrogenase (LDH): Secondary | ICD-10-CM

## 2019-06-25 DIAGNOSIS — E1165 Type 2 diabetes mellitus with hyperglycemia: Secondary | ICD-10-CM

## 2019-07-01 ENCOUNTER — Telehealth: Payer: Self-pay | Admitting: Family Medicine

## 2019-07-01 ENCOUNTER — Other Ambulatory Visit: Payer: Self-pay | Admitting: Family Medicine

## 2019-07-01 DIAGNOSIS — R7401 Elevation of levels of liver transaminase levels: Secondary | ICD-10-CM

## 2019-07-01 DIAGNOSIS — R7402 Elevation of levels of lactic acid dehydrogenase (LDH): Secondary | ICD-10-CM

## 2019-07-01 DIAGNOSIS — IMO0002 Reserved for concepts with insufficient information to code with codable children: Secondary | ICD-10-CM

## 2019-07-01 DIAGNOSIS — E890 Postprocedural hypothyroidism: Secondary | ICD-10-CM

## 2019-07-01 DIAGNOSIS — E1165 Type 2 diabetes mellitus with hyperglycemia: Secondary | ICD-10-CM

## 2019-07-01 DIAGNOSIS — I10 Essential (primary) hypertension: Secondary | ICD-10-CM

## 2019-07-01 NOTE — Telephone Encounter (Signed)
Pt states that she went without a job for some time and would like to continue care with Dr Charlett Blake, Will Dr Charlett Blake take patient back on as a patient. Please advise. Patient is in need of medication refill.

## 2019-07-01 NOTE — Telephone Encounter (Signed)
Last OV 03/07/2016

## 2019-07-01 NOTE — Telephone Encounter (Signed)
Yes I will take her back as a patient. Find out what refills she needs and we can get her a 30 day supply and then let's get her an appt before those run out. Can do a quick virtual if we need to just to get restarted if she is willing. Then we can arrange more long term care.

## 2019-07-02 ENCOUNTER — Telehealth: Payer: Self-pay

## 2019-07-02 DIAGNOSIS — E890 Postprocedural hypothyroidism: Secondary | ICD-10-CM

## 2019-07-02 DIAGNOSIS — I10 Essential (primary) hypertension: Secondary | ICD-10-CM

## 2019-07-02 DIAGNOSIS — IMO0002 Reserved for concepts with insufficient information to code with codable children: Secondary | ICD-10-CM

## 2019-07-02 DIAGNOSIS — E1165 Type 2 diabetes mellitus with hyperglycemia: Secondary | ICD-10-CM

## 2019-07-02 DIAGNOSIS — R7401 Elevation of levels of liver transaminase levels: Secondary | ICD-10-CM

## 2019-07-02 DIAGNOSIS — R7402 Elevation of levels of lactic acid dehydrogenase (LDH): Secondary | ICD-10-CM

## 2019-07-02 MED ORDER — LEVOTHYROXINE SODIUM 200 MCG PO TABS: ORAL_TABLET | ORAL | 0 refills | Status: DC

## 2019-07-02 MED ORDER — VENLAFAXINE HCL ER 75 MG PO CP24: 75.0000 mg | ORAL_CAPSULE | Freq: Three times a day (TID) | ORAL | 0 refills | Status: DC

## 2019-07-02 NOTE — Telephone Encounter (Signed)
Called again/no answer/mailbox full. The patient does need to be advised of PCP response to her request, will need a quick appt/virtual ok to get started back with PCP.

## 2019-07-02 NOTE — Telephone Encounter (Signed)
Called (home number)//someone did answer and this is not a correct number and did update in her chart. Called cell number no answer/mailbox full.

## 2019-07-02 NOTE — Telephone Encounter (Signed)
The patient did call back (See other telephone note dated today 07/02/19) Refilled per patient request for 30 days the Effexor and euthyrox. Tried to call the patient again, but no answer/mail box full

## 2019-07-02 NOTE — Telephone Encounter (Signed)
Have refilled these as PCP did ok per telephone note dated 07/01/2019. Tried to call the patient back/no answer/mailbox full.

## 2019-07-02 NOTE — Telephone Encounter (Signed)
Called the patient tried to leave msg. But mailbox full

## 2019-07-02 NOTE — Telephone Encounter (Signed)
Called no answer/mailbox was full

## 2019-07-02 NOTE — Telephone Encounter (Signed)
Called cell number (mailbox full) and home number [phone] left message to call back.

## 2019-07-02 NOTE — Telephone Encounter (Signed)
Patient called in to see if Dr. Charlett Blake can send in a prescription for a 30 day supply for :  EUTHYROX 200 MCG tablet [id number]    &  venlafaxine XR (EFFEXOR XR) 75 MG 24 hr capsule [id number]   Please send it to the [facility] at [address] [place], [place] [address]  Phone number to the Pharmacy is [phone].  Please follow up with the patient at [phone]  Thanks

## 2019-07-03 ENCOUNTER — Telehealth: Payer: Self-pay | Admitting: Family Medicine

## 2019-07-03 NOTE — Telephone Encounter (Signed)
Spoke with patient and pharmacist regarding levothyroxine.  Pharmacist requesting approval for change in manufacturer, medication and dosage will not change.  Approved change in manufacturer.

## 2019-07-03 NOTE — Telephone Encounter (Signed)
LM requesting call back to discuss medication.

## 2019-07-03 NOTE — Telephone Encounter (Signed)
Patient states that Pharmancy has to change Prescription to another substitute. Patient states she need medication

## 2019-07-18 ENCOUNTER — Ambulatory Visit: Payer: BLUE CROSS/BLUE SHIELD | Admitting: Family Medicine

## 2019-07-30 ENCOUNTER — Encounter: Payer: Self-pay | Admitting: Certified Nurse Midwife

## 2019-08-05 ENCOUNTER — Other Ambulatory Visit: Payer: Self-pay

## 2019-08-05 ENCOUNTER — Ambulatory Visit (INDEPENDENT_AMBULATORY_CARE_PROVIDER_SITE_OTHER): Payer: Self-pay | Admitting: Family Medicine

## 2019-08-05 DIAGNOSIS — E782 Mixed hyperlipidemia: Secondary | ICD-10-CM

## 2019-08-05 DIAGNOSIS — E890 Postprocedural hypothyroidism: Secondary | ICD-10-CM

## 2019-08-05 DIAGNOSIS — F329 Major depressive disorder, single episode, unspecified: Secondary | ICD-10-CM

## 2019-08-05 DIAGNOSIS — E11649 Type 2 diabetes mellitus with hypoglycemia without coma: Secondary | ICD-10-CM

## 2019-08-05 DIAGNOSIS — E559 Vitamin D deficiency, unspecified: Secondary | ICD-10-CM

## 2019-08-05 DIAGNOSIS — I10 Essential (primary) hypertension: Secondary | ICD-10-CM

## 2019-08-05 DIAGNOSIS — F32A Depression, unspecified: Secondary | ICD-10-CM

## 2019-08-05 DIAGNOSIS — F419 Anxiety disorder, unspecified: Secondary | ICD-10-CM

## 2019-08-05 MED ORDER — LISINOPRIL 5 MG PO TABS: 5.0000 mg | ORAL_TABLET | Freq: Every day | ORAL | 1 refills | Status: DC

## 2019-08-05 MED ORDER — LEVOTHYROXINE SODIUM 200 MCG PO TABS: ORAL_TABLET | ORAL | 0 refills | Status: DC

## 2019-08-05 MED ORDER — POTASSIUM CHLORIDE CRYS ER 10 MEQ PO TBCR: 10.0000 meq | EXTENDED_RELEASE_TABLET | Freq: Two times a day (BID) | ORAL | 1 refills | Status: DC

## 2019-08-06 ENCOUNTER — Telehealth: Payer: Self-pay | Admitting: *Deleted

## 2019-08-06 NOTE — Telephone Encounter (Signed)
Left message on machine to call back to schedule appointment   Needs lab appointment next week and physical 3 months after lab appointment.

## 2019-08-07 NOTE — Assessment & Plan Note (Addendum)
Encouraged heart healthy diet, increase exercise, avoid trans fats, consider a krill oil cap daily. She stopped Rosuvastatin a month ago thinking it was contributing to her aches and pains she thinks maybe they are slightly better. Will hold off on them for now. reassess

## 2019-08-07 NOTE — Assessment & Plan Note (Signed)
hgba1c acceptable, minimize simple carbs. Increase exercise as tolerated. Continue current meds 

## 2019-08-07 NOTE — Assessment & Plan Note (Signed)
Monitor and report any concerns, no changes to meds. Encouraged heart healthy diet such as the DASH diet and exercise as tolerated.  ?

## 2019-08-07 NOTE — Progress Notes (Signed)
Virtual Visit via Video Note  I connected with Colleen Baldwin on 08/05/19 at  4:00 PM EDT by a video enabled telemedicine application and verified that I am speaking with the correct person using two identifiers.  Location: Patient: home Provider: office   I discussed the limitations of evaluation and management by telemedicine and the availability of in person appointments. The patient expressed understanding and agreed to proceed. Kem Boroughs, CMA was able to get the patient setup on a video visit   Subjective:    Patient ID: Colleen Baldwin, female    DOB: August 03, 1960, 59 y.o.   MRN: [id number]  Chief Complaint  Patient presents with  . Follow-up  . Medication Refill  . patient would like to discuss crestor hurting arms    HPI Patient is in today for new patient appointment and refill on medications. She has a pst medical history of diabetes, hypertension, hyperlipidemia, depression, anxiety and more. 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 No recent febrile illness or hospitalizations. She has tried to stay active and eat well. Doe snot endorse polyuria or polydipsia. Has not had lab work done in some time. Denies CP/palp/SOB/HA/congestion/fevers/GI or GU c/o. Taking meds as prescribed. She did not some myalgias but stopped rosuvastatin last month and she thinks it helped.   Past Medical History:  Diagnosis Date  . Anemia 06/10/2013  . Asthma   . Depression   . Diabetes mellitus    Type II  uncontrolled  . Dysplasia of cervix, low grade (CIN 1) 1992   Cryo   . HPV test positive, pap was normal 12/01/2015   genotype # 16/18/45 was negative.  Repeat pap 2018  . Hyperlipidemia   . Hypertension   . Hypothyroidism    s/p thyroidectomy for goiter  . Low back pain 03/07/2016  . 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 Personal history of colonic polyps 11/20/2012   Follows with [facility] [place]  . Preventative health care 11/20/2012  . TMJ  dysfunction 06/10/2013    Past Surgical History:  Procedure Laterality Date  . CHOLECYSTECTOMY  05-2004  . IUD REMOVAL  07/20/2008   Of embedded paraguard  . LAPAROSCOPIC CHOLECYSTECTOMY  2006  . THYROIDECTOMY  09/1994  . TONSILLECTOMY AND ADENOIDECTOMY      Family History  Problem Relation Age of Onset  . Cancer Mother        ?uterine  . Diabetes Father   . Thyroid disease Father   . Stroke Father   . Hypertension Sister   . Diabetes Sister   . Heart failure Maternal Grandfather   . Diabetes Paternal Grandmother   . Heart failure Paternal Grandfather   . Thyroid disease Sister   . Breast cancer Neg Hx     Social History   Socioeconomic History  . Marital status: Legally Separated    Spouse name: Not on file  . Number of children: 0  . Years of education: Not on file  . Highest education level: Not on file  Occupational History  . Occupation: INTERIOR DESIGNER    Employer: [employer]  Tobacco Use  . Smoking status: Never Smoker  . Smokeless tobacco: Never Used  Substance and Sexual Activity  . Alcohol use: Yes    Comment: 0-1 a month  . Drug use: No  . Sexual activity: Not Currently    Partners: Male    Birth control/protection: Post-menopausal  Other Topics Concern  . Not on file  Social History Narrative  . Not on file   Social Determinants of Health   Financial Resource Strain:   . Difficulty of Paying Living Expenses:   Food Insecurity:   . Worried About Charity fundraiser in the Last Year:   . Arboriculturist in the Last Year:   Transportation Needs:   . Film/video editor (Medical):   Marland Kitchen Lack of Transportation (Non-Medical):   Physical Activity:   . Days of Exercise per Week:   . Minutes of Exercise per Session:   Stress:   . Feeling of Stress :   Social Connections:   . Frequency of Communication with Friends and Family:   . Frequency of Social Gatherings with Friends and Family:   . Attends Religious Services:   . Active  Member of Clubs or Organizations:   . Attends Archivist Meetings:   Marland Kitchen Marital Status:   Intimate Partner Violence:   . Fear of Current or Ex-Partner:   . Emotionally Abused:   Marland Kitchen Physically Abused:   . Sexually Abused:     Outpatient Medications Prior to Visit  Medication Sig Dispense Refill  . aspirin 81 MG tablet Take 81 mg by mouth daily.      Marland Kitchen BLACK COHOSH PO Take by mouth.    . ferrous sulfate 325 (65 FE) MG tablet Take 325 mg by mouth every evening.    Marland Kitchen MELATONIN PO Take by mouth.    . metFORMIN (GLUCOPHAGE) 1000 MG tablet TAKE 1 TABLET BY MOUTH TWICE DAILY WITH MEALS (Patient taking differently: Take 1,000 mg by mouth daily with breakfast. ) 180 tablet 0  . rosuvastatin (CRESTOR) 20 MG tablet Take 1 tablet by mouth once daily 90 tablet 0  . UNABLE TO FIND Calcium, mag, zinc takes 2 a day    . venlafaxine XR (EFFEXOR XR) 75 MG 24 hr capsule Take 1 capsule (75 mg total) by mouth 3 (three) times daily. 90 capsule 0  . levothyroxine (EUTHYROX) 200 MCG tablet TAKE 1 TABLET BY MOUTH ONCE DAILY BEFORE BREAKFAST 30 tablet 0  . lisinopril (PRINIVIL,ZESTRIL) 5 MG tablet TAKE 1 TABLET BY MOUTH ONCE DAILY 90 tablet 2  . potassium chloride (K-DUR,KLOR-CON) 10 MEQ tablet Take 10 mEq by mouth 2 (two) times daily.    . Aspirin-Acetaminophen-Caffeine (EXCEDRIN PO) Take by mouth as needed.    . Biotin 10 MG TABS Take 1 tablet by mouth every morning.    . cetirizine (ZYRTEC) 10 MG tablet Take 1 tablet by mouth 1-2 times a week.     Marland Kitchen glucose blood (ACCU-CHEK AVIVA) test strip Check BS bid and prn.  DX: 250.02 100 each 3  . glucose blood test strip 1 each by Other route 2 (two) times daily as needed for other. Use as instructed 100 each 12  . ibuprofen (ADVIL,MOTRIN) 200 MG tablet Take 2-3 tablets by mouth up to 3 times a day as needed for pain.     . Insulin Pen Needle (B-D ULTRAFINE III SHORT PEN) 31G X 8 MM MISC As directed. 100 each 3  . Liraglutide 18 MG/3ML SOPN Inject 1 mL (6 mg  total) into the skin daily. (Patient not taking: Reported on 02/14/2019) 6 mL    No facility-administered medications prior to visit.    Allergies  Allergen Reactions  . Shrimp [Shellfish Allergy] Swelling    Review of Systems  Constitutional: Positive for malaise/fatigue. Negative for fever.  HENT: Negative for congestion.   Eyes: Negative  for blurred vision.  Respiratory: Negative for shortness of breath.   Cardiovascular: Negative for chest pain, palpitations and leg swelling.  Gastrointestinal: Negative for abdominal pain, blood in stool and nausea.  Genitourinary: Negative for dysuria and frequency.  Musculoskeletal: Negative for falls.  Skin: Negative for rash.  Neurological: Positive for dizziness. Negative for loss of consciousness and headaches.  Endo/Heme/Allergies: Negative for environmental allergies.  Psychiatric/Behavioral: Negative for depression. The patient is nervous/anxious.        Objective:    Physical Exam Constitutional:      Appearance: Normal appearance. She is not ill-appearing.  HENT:     Head: Normocephalic and atraumatic.     Nose: Nose normal.  Eyes:     General:        Right eye: No discharge.        Left eye: No discharge.  Pulmonary:     Effort: Pulmonary effort is normal.  Neurological:     Mental Status: She is alert and oriented to person, place, and time.  Psychiatric:        Behavior: Behavior normal.     LMP 02/15/2012  Wt Readings from Last 3 Encounters:  02/14/19 152 lb (68.9 kg)  02/11/18 163 lb (73.9 kg)  10/09/17 165 lb (74.8 kg)    Diabetic Foot Exam - Simple   No data filed     Lab Results  Component Value Date   WBC 9.3 03/07/2016   HGB 14.3 03/07/2016   HCT 41.3 03/07/2016   PLT 290.0 03/07/2016   GLUCOSE 300 (H) 03/07/2016   CHOL 167 03/07/2016   TRIG 169.0 (H) 03/07/2016   HDL 51.40 03/07/2016   LDLCALC 82 03/07/2016   ALT 16 03/07/2016   AST 15 03/07/2016   NA 136 03/07/2016   K 3.9 03/07/2016    CL 97 03/07/2016   CREATININE 0.55 03/07/2016   BUN 14 03/07/2016   CO2 28 03/07/2016   TSH 1.16 03/07/2016   HGBA1C 11.0 (H) 03/07/2016   MICROALBUR 3.0 (H) 06/24/2014    Lab Results  Component Value Date   TSH 1.16 03/07/2016   Lab Results  Component Value Date   WBC 9.3 03/07/2016   HGB 14.3 03/07/2016   HCT 41.3 03/07/2016   MCV 87.5 03/07/2016   PLT 290.0 03/07/2016   Lab Results  Component Value Date   NA 136 03/07/2016   K 3.9 03/07/2016   CO2 28 03/07/2016   GLUCOSE 300 (H) 03/07/2016   BUN 14 03/07/2016   CREATININE 0.55 03/07/2016   BILITOT 0.6 03/07/2016   ALKPHOS 109 03/07/2016   AST 15 03/07/2016   ALT 16 03/07/2016   PROT 6.9 03/07/2016   ALBUMIN 4.2 03/07/2016   CALCIUM 9.3 03/07/2016   GFR 122.16 03/07/2016   Lab Results  Component Value Date   CHOL 167 03/07/2016   Lab Results  Component Value Date   HDL 51.40 03/07/2016   Lab Results  Component Value Date   LDLCALC 82 03/07/2016   Lab Results  Component Value Date   TRIG 169.0 (H) 03/07/2016   Lab Results  Component Value Date   CHOLHDL 3 03/07/2016   Lab Results  Component Value Date   HGBA1C 11.0 (H) 03/07/2016       Assessment & Plan:   Problem List Items Addressed This Visit    Hypothyroidism - Primary    On Levothyroxine, continue to monitor      Relevant Medications   levothyroxine (EUTHYROX) 200 MCG tablet  Diabetes type 2, uncontrolled (HCC)    hgba1c acceptable, minimize simple carbs. Increase exercise as tolerated. Continue current meds      Relevant Medications   lisinopril (ZESTRIL) 5 MG tablet   Other Relevant Orders   Hemoglobin A1c   Hyperlipidemia, mixed    Encouraged heart healthy diet, increase exercise, avoid trans fats, consider a krill oil cap daily. She stopped Rosuvastatin a month ago thinking it was contributing to her aches and pains she thinks maybe they are slightly better. Will hold off on them for now. reassess      Relevant  Medications   lisinopril (ZESTRIL) 5 MG tablet   Other Relevant Orders   Lipid panel   Essential hypertension    Monitor and report any concerns, no changes to meds. Encouraged heart healthy diet such as the DASH diet and exercise as tolerated.       Relevant Medications   lisinopril (ZESTRIL) 5 MG tablet   Other Relevant Orders   CBC   Comprehensive metabolic panel   TSH   Anxiety and depression    Stable on Venlafaxine refill given.       Vitamin D deficiency    Supplement and monitor         I have discontinued Lattie Haw T. Quirarte's ibuprofen, cetirizine, Aspirin-Acetaminophen-Caffeine (EXCEDRIN PO), Biotin, glucose blood, glucose blood, Insulin Pen Needle, and liraglutide. I have also changed her lisinopril and potassium chloride. Additionally, I am having her maintain her aspirin, ferrous sulfate, metFORMIN, BLACK COHOSH PO, UNABLE TO FIND, MELATONIN PO, rosuvastatin, venlafaxine XR, and levothyroxine.  Meds ordered this encounter  Medications  . lisinopril (ZESTRIL) 5 MG tablet    Sig: Take 1 tablet (5 mg total) by mouth daily.    Dispense:  90 tablet    Refill:  1  . potassium chloride (KLOR-CON) 10 MEQ tablet    Sig: Take 1 tablet (10 mEq total) by mouth 2 (two) times daily.    Dispense:  180 tablet    Refill:  1  . levothyroxine (EUTHYROX) 200 MCG tablet    Sig: TAKE 1 TABLET BY MOUTH ONCE DAILY BEFORE BREAKFAST    Dispense:  90 tablet    Refill:  0     I discussed the assessment and treatment plan with the patient. The patient was provided an opportunity to ask questions and all were answered. The patient agreed with the plan and demonstrated an understanding of the instructions.   The patient was advised to call back or seek an in-person evaluation if the symptoms worsen or if the condition fails to improve as anticipated.  I provided 35 minutes of non-face-to-face time during this encounter.   Penni Homans, MD

## 2019-08-07 NOTE — Assessment & Plan Note (Signed)
Supplement and monitor 

## 2019-08-07 NOTE — Assessment & Plan Note (Signed)
Stable on Venlafaxine refill given.

## 2019-08-07 NOTE — Assessment & Plan Note (Signed)
On Levothyroxine, continue to monitor 

## 2019-08-22 ENCOUNTER — Other Ambulatory Visit: Payer: Self-pay

## 2019-08-22 ENCOUNTER — Other Ambulatory Visit (INDEPENDENT_AMBULATORY_CARE_PROVIDER_SITE_OTHER): Payer: BC Managed Care – PPO

## 2019-08-22 DIAGNOSIS — I10 Essential (primary) hypertension: Secondary | ICD-10-CM | POA: Diagnosis not present

## 2019-08-22 DIAGNOSIS — E11649 Type 2 diabetes mellitus with hypoglycemia without coma: Secondary | ICD-10-CM

## 2019-08-22 DIAGNOSIS — E782 Mixed hyperlipidemia: Secondary | ICD-10-CM

## 2019-08-22 LAB — COMPREHENSIVE METABOLIC PANEL
ALT: 15 U/L (ref 0–35)
AST: 14 U/L (ref 0–37)
Albumin: 4.2 g/dL (ref 3.5–5.2)
Alkaline Phosphatase: 158 U/L — ABNORMAL HIGH (ref 39–117)
BUN: 13 mg/dL (ref 6–23)
CO2: 31 mEq/L (ref 19–32)
Calcium: 8.8 mg/dL (ref 8.4–10.5)
Chloride: 94 mEq/L — ABNORMAL LOW (ref 96–112)
Creatinine, Ser: 0.46 mg/dL (ref 0.40–1.20)
GFR: 139.51 mL/min (ref >60.00)
Glucose, Bld: 351 mg/dL — ABNORMAL HIGH (ref 70–99)
Potassium: 4.8 mEq/L (ref 3.5–5.1)
Sodium: 131 mEq/L — ABNORMAL LOW (ref 135–145)
Total Bilirubin: 0.7 mg/dL (ref 0.2–1.2)
Total Protein: 6.9 g/dL (ref 6.0–8.3)

## 2019-08-22 LAB — LIPID PANEL
Cholesterol: 333 mg/dL — ABNORMAL HIGH (ref 0–200)
HDL: 62 mg/dL (ref >39.00)
LDL Cholesterol: 239 mg/dL — ABNORMAL HIGH (ref 0–99)
NonHDL: 270.92
Total CHOL/HDL Ratio: 5
Triglycerides: 158 mg/dL — ABNORMAL HIGH (ref 0.0–149.0)
VLDL: 31.6 mg/dL (ref 0.0–40.0)

## 2019-08-22 LAB — CBC
HCT: 43.4 % (ref 36.0–46.0)
Hemoglobin: 15.1 g/dL — ABNORMAL HIGH (ref 12.0–15.0)
MCHC: 34.8 g/dL (ref 30.0–36.0)
MCV: 88.8 fl (ref 78.0–100.0)
Platelets: 307 10*3/uL (ref 150.0–400.0)
RBC: 4.89 Mil/uL (ref 3.87–5.11)
RDW: 13.1 % (ref 11.5–15.5)
WBC: 9 10*3/uL (ref 4.0–10.5)

## 2019-08-22 LAB — TSH: TSH: 4.9 u[IU]/mL — ABNORMAL HIGH (ref 0.35–4.50)

## 2019-08-22 LAB — HEMOGLOBIN A1C: Hgb A1c MFr Bld: 13.7 % — ABNORMAL HIGH (ref 4.6–6.5)

## 2019-08-29 ENCOUNTER — Encounter: Payer: Self-pay | Admitting: *Deleted

## 2019-09-01 ENCOUNTER — Telehealth: Payer: Self-pay

## 2019-09-01 DIAGNOSIS — R7402 Elevation of levels of lactic acid dehydrogenase (LDH): Secondary | ICD-10-CM

## 2019-09-01 DIAGNOSIS — R7401 Elevation of levels of liver transaminase levels: Secondary | ICD-10-CM

## 2019-09-01 DIAGNOSIS — I10 Essential (primary) hypertension: Secondary | ICD-10-CM

## 2019-09-01 DIAGNOSIS — IMO0002 Reserved for concepts with insufficient information to code with codable children: Secondary | ICD-10-CM

## 2019-09-01 DIAGNOSIS — E890 Postprocedural hypothyroidism: Secondary | ICD-10-CM

## 2019-09-01 DIAGNOSIS — E1165 Type 2 diabetes mellitus with hyperglycemia: Secondary | ICD-10-CM

## 2019-09-01 MED ORDER — VENLAFAXINE HCL ER 75 MG PO CP24: 75.0000 mg | ORAL_CAPSULE | Freq: Three times a day (TID) | ORAL | 1 refills | Status: DC

## 2019-09-01 NOTE — Telephone Encounter (Signed)
Patient  Called in to see if Dr. Charlett Blake could send in a prescription for  venlafaxine XR (EFFEXOR XR) 75 MG 24 hr capsule [id number]   Please send it to [id number] - [place], [address]  [place], [place] [address]  Phone:  [phone] Fax:  [phone]  DEA #:  [id number]

## 2019-09-01 NOTE — Telephone Encounter (Signed)
Left message on machine that rx was sent in.  

## 2019-09-25 ENCOUNTER — Other Ambulatory Visit: Payer: Self-pay | Admitting: Family Medicine

## 2019-09-25 DIAGNOSIS — IMO0002 Reserved for concepts with insufficient information to code with codable children: Secondary | ICD-10-CM

## 2019-09-25 DIAGNOSIS — I10 Essential (primary) hypertension: Secondary | ICD-10-CM

## 2019-09-25 DIAGNOSIS — E890 Postprocedural hypothyroidism: Secondary | ICD-10-CM

## 2019-09-25 DIAGNOSIS — R7401 Elevation of levels of liver transaminase levels: Secondary | ICD-10-CM

## 2019-09-25 DIAGNOSIS — R7402 Elevation of levels of lactic acid dehydrogenase (LDH): Secondary | ICD-10-CM

## 2019-09-25 DIAGNOSIS — E1165 Type 2 diabetes mellitus with hyperglycemia: Secondary | ICD-10-CM

## 2019-10-03 ENCOUNTER — Telehealth: Payer: Self-pay | Admitting: Family Medicine

## 2019-10-03 MED ORDER — METFORMIN HCL 1000 MG PO TABS: 1000.0000 mg | ORAL_TABLET | Freq: Two times a day (BID) | ORAL | 1 refills | Status: DC

## 2019-10-03 MED ORDER — ATORVASTATIN CALCIUM 10 MG PO TABS: 10.0000 mg | ORAL_TABLET | Freq: Every day | ORAL | 3 refills | Status: DC

## 2019-10-03 MED ORDER — GLIMEPIRIDE 2 MG PO TABS: 2.0000 mg | ORAL_TABLET | Freq: Two times a day (BID) | ORAL | 3 refills | Status: DC

## 2019-10-03 NOTE — Telephone Encounter (Signed)
Caller: Shavetta Call back phone number: [phone]  Kataryna would like her labs result    Medication: metFORMIN (GLUCOPHAGE) 1000 MG tablet     Has the patient contacted their pharmacy?  (If no, request that the patient contact the pharmacy for the refill.) (If yes, when and what did the pharmacy advise?)     Preferred Pharmacy (with phone number or street name): [id number] - [place] [place] [address], [place] [address]  Phone:  [phone] Fax:  [phone]      Agent: Please be advised that RX refills may take up to 3 business days. We ask that you follow-up with your pharmacy.

## 2019-10-03 NOTE — Telephone Encounter (Signed)
Left message on machine for patient to call back.  Sent in prescription metformin  and also sent in glimiperide and atorvastatin that needed to be added from lab results (see labs)

## 2019-10-07 NOTE — Telephone Encounter (Signed)
Left detailed message on machine with lab results.

## 2019-10-30 ENCOUNTER — Other Ambulatory Visit: Payer: Self-pay | Admitting: Family Medicine

## 2019-12-31 ENCOUNTER — Other Ambulatory Visit: Payer: Self-pay | Admitting: Family Medicine

## 2020-01-18 ENCOUNTER — Other Ambulatory Visit: Payer: Self-pay | Admitting: Family Medicine

## 2020-01-18 DIAGNOSIS — R7401 Elevation of levels of liver transaminase levels: Secondary | ICD-10-CM

## 2020-01-18 DIAGNOSIS — I10 Essential (primary) hypertension: Secondary | ICD-10-CM

## 2020-01-18 DIAGNOSIS — E1165 Type 2 diabetes mellitus with hyperglycemia: Secondary | ICD-10-CM

## 2020-01-18 DIAGNOSIS — R7402 Elevation of levels of lactic acid dehydrogenase (LDH): Secondary | ICD-10-CM

## 2020-01-18 DIAGNOSIS — IMO0002 Reserved for concepts with insufficient information to code with codable children: Secondary | ICD-10-CM

## 2020-01-18 DIAGNOSIS — E890 Postprocedural hypothyroidism: Secondary | ICD-10-CM

## 2020-02-11 ENCOUNTER — Other Ambulatory Visit: Payer: Self-pay | Admitting: Family Medicine

## 2020-02-17 NOTE — Progress Notes (Deleted)
59 y.o. G1P0010 Legally Separated Caucasian female here for annual exam.    PCP:     Patient's last menstrual period was 02/15/2012.           Sexually active: {yes no:314532}  The current method of family planning is post menopausal status.    Exercising: {yes no:314532}  {types:19826} Smoker:  no  Health Maintenance: Pap: 02-11-18 Neg:Neg HR HPV, 02-07-17 Neg:Neg HR HPV, 12-01-15 Neg:Pos HR HPV--with Neg 16/18/45 History of abnormal Pap:  Yes,  12-01-15 Neg:Pos HR HPV--with Neg 16/18/45. Hx of cryotherapy to cervix  MMG:  ***10-10-17 Diag.Bil.w/Lt.Br.US/Benign oil cyst left breast/Neg/density C/BiRads2 Colonoscopy: 04/2017 Neg;next 5 years BMD:   n/a  Result  n/a TDaP:  06-24-14 Gardasil:   no HIV:Neg in the past Hep C: Unsure Screening Labs:  Hb today: ***, Urine today: ***   reports that she has never smoked. She has never used smokeless tobacco. She reports current alcohol use. She reports that she does not use drugs.  Past Medical History:  Diagnosis Date  . Anemia 06/10/2013  . Asthma   . Depression   . Diabetes mellitus    Type II  uncontrolled  . Dysplasia of cervix, low grade (CIN 1) 1992   Cryo   . HPV test positive, pap was normal 12/01/2015   genotype # 16/18/45 was negative.  Repeat pap 2018  . Hyperlipidemia   . Hypertension   . Hypothyroidism    s/p thyroidectomy for goiter  . Low back pain 03/07/2016  . 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 Personal history of colonic polyps 11/20/2012   Follows with [facility] [place]  . Preventative health care 11/20/2012  . TMJ dysfunction 06/10/2013    Past Surgical History:  Procedure Laterality Date  . CHOLECYSTECTOMY  05-2004  . IUD REMOVAL  07/20/2008   Of embedded paraguard  . LAPAROSCOPIC CHOLECYSTECTOMY  2006  . THYROIDECTOMY  09/1994  . TONSILLECTOMY AND ADENOIDECTOMY      Current Outpatient Medications  Medication Sig Dispense Refill  . aspirin 81 MG tablet Take 81 mg by mouth daily.      Marland Kitchen atorvastatin  (LIPITOR) 10 MG tablet Take 1 tablet (10 mg total) by mouth daily. 90 tablet 0  . BLACK COHOSH PO Take by mouth.    . ferrous sulfate 325 (65 FE) MG tablet Take 325 mg by mouth every evening.    Marland Kitchen glimepiride (AMARYL) 2 MG tablet Take 1 tablet (2 mg total) by mouth in the morning and at bedtime. 180 tablet 0  . levothyroxine (SYNTHROID) 200 MCG tablet TAKE 1 TABLET BY MOUTH ONCE DAILY BEFORE BREAKFAST 90 tablet 0  . lisinopril (ZESTRIL) 5 MG tablet Take 1 tablet (5 mg total) by mouth daily. 90 tablet 1  . MELATONIN PO Take by mouth.    . metFORMIN (GLUCOPHAGE) 1000 MG tablet Take 1 tablet (1,000 mg total) by mouth 2 (two) times daily with a meal. 180 tablet 1  . potassium chloride (KLOR-CON) 10 MEQ tablet Take 1 tablet (10 mEq total) by mouth 2 (two) times daily. 180 tablet 1  . rosuvastatin (CRESTOR) 20 MG tablet Take 1 tablet by mouth once daily 90 tablet 0  . UNABLE TO FIND Calcium, mag, zinc takes 2 a day    . venlafaxine XR (EFFEXOR-XR) 75 MG 24 hr capsule TAKE 1 CAPSULE BY MOUTH THREE TIMES DAILY 90 capsule 0   No current facility-administered medications for this visit.    Family History  Problem Relation Age  of Onset  . Cancer Mother        ?uterine  . Diabetes Father   . Thyroid disease Father   . Stroke Father   . Hypertension Sister   . Diabetes Sister   . Heart failure Maternal Grandfather   . Diabetes Paternal Grandmother   . Heart failure Paternal Grandfather   . Thyroid disease Sister   . Breast cancer Neg Hx     Review of Systems  Exam:   LMP 02/15/2012     General appearance: alert, cooperative and appears stated age Head: normocephalic, without obvious abnormality, atraumatic Neck: no adenopathy, supple, symmetrical, trachea midline and thyroid normal to inspection and palpation Lungs: clear to auscultation bilaterally Breasts: normal appearance, no masses or tenderness, No nipple retraction or dimpling, No nipple discharge or bleeding, No axillary  adenopathy Heart: regular rate and rhythm Abdomen: soft, non-tender; no masses, no organomegaly Extremities: extremities normal, atraumatic, no cyanosis or edema Skin: skin color, texture, turgor normal. No rashes or lesions Lymph nodes: cervical, supraclavicular, and axillary nodes normal. Neurologic: grossly normal  Pelvic: External genitalia:  no lesions              No abnormal inguinal nodes palpated.              Urethra:  normal appearing urethra with no masses, tenderness or lesions              Bartholins and Skenes: normal                 Vagina: normal appearing vagina with normal color and discharge, no lesions              Cervix: no lesions              Pap taken: {yes no:314532} Bimanual Exam:  Uterus:  normal size, contour, position, consistency, mobility, non-tender              Adnexa: no mass, fullness, tenderness              Rectal exam: {yes no:314532}.  Confirms.              Anus:  normal sphincter tone, no lesions  Chaperone was present for exam.  Assessment:   Well woman visit with normal exam.   Plan: Mammogram screening discussed. Self breast awareness reviewed. Pap and HR HPV as above. Guidelines for Calcium, Vitamin D, regular exercise program including cardiovascular and weight bearing exercise.   Follow up annually and prn.   Additional counseling given.  {yes [id number]. _______ minutes face to face time of which over 50% was spent in counseling.    After visit summary provided.

## 2020-02-18 ENCOUNTER — Encounter: Payer: Self-pay | Admitting: Obstetrics and Gynecology

## 2020-02-18 ENCOUNTER — Telehealth: Payer: Self-pay

## 2020-02-18 ENCOUNTER — Ambulatory Visit: Payer: BC Managed Care – PPO | Admitting: Obstetrics and Gynecology

## 2020-02-18 NOTE — Telephone Encounter (Signed)
1 mo recall placed.   Encounter closed.

## 2020-02-18 NOTE — Telephone Encounter (Signed)
Patient left message to cancel AEX today due to insurance. Patient stated she will call back to reschedule.

## 2020-04-29 ENCOUNTER — Other Ambulatory Visit: Payer: Self-pay | Admitting: Family Medicine

## 2020-04-29 DIAGNOSIS — IMO0002 Reserved for concepts with insufficient information to code with codable children: Secondary | ICD-10-CM

## 2020-04-29 DIAGNOSIS — E1165 Type 2 diabetes mellitus with hyperglycemia: Secondary | ICD-10-CM

## 2020-04-29 DIAGNOSIS — R7401 Elevation of levels of liver transaminase levels: Secondary | ICD-10-CM

## 2020-04-29 DIAGNOSIS — I10 Essential (primary) hypertension: Secondary | ICD-10-CM

## 2020-04-29 DIAGNOSIS — E890 Postprocedural hypothyroidism: Secondary | ICD-10-CM

## 2020-05-13 ENCOUNTER — Encounter: Payer: Self-pay | Admitting: Medical

## 2020-05-26 ENCOUNTER — Telehealth: Payer: Self-pay | Admitting: Medical

## 2020-05-26 ENCOUNTER — Other Ambulatory Visit: Payer: Self-pay

## 2020-05-26 ENCOUNTER — Ambulatory Visit (HOSPITAL_BASED_OUTPATIENT_CLINIC_OR_DEPARTMENT_OTHER)
Admission: RE | Admit: 2020-05-26 | Discharge: 2020-05-26 | Disposition: A | Discharge status: Home / Self Care | Payer: Self-pay | Source: Ambulatory Visit | Attending: Medical | Admitting: Medical

## 2020-05-26 ENCOUNTER — Encounter: Payer: Self-pay | Admitting: Medical

## 2020-05-26 ENCOUNTER — Ambulatory Visit (INDEPENDENT_AMBULATORY_CARE_PROVIDER_SITE_OTHER): Payer: PRIVATE HEALTH INSURANCE | Admitting: Medical

## 2020-05-26 ENCOUNTER — Encounter: Payer: Self-pay | Admitting: Emergency Medicine

## 2020-05-26 VITALS — BP 130/69 | HR 103 | Temp 98.5°F | Resp 12 | Ht 64.0 in | Wt 155.4 lb

## 2020-05-26 DIAGNOSIS — E039 Hypothyroidism, unspecified: Secondary | ICD-10-CM

## 2020-05-26 DIAGNOSIS — IMO0002 Reserved for concepts with insufficient information to code with codable children: Secondary | ICD-10-CM

## 2020-05-26 DIAGNOSIS — Z0000 Encounter for general adult medical examination without abnormal findings: Secondary | ICD-10-CM | POA: Diagnosis not present

## 2020-05-26 DIAGNOSIS — E118 Type 2 diabetes mellitus with unspecified complications: Secondary | ICD-10-CM | POA: Diagnosis not present

## 2020-05-26 DIAGNOSIS — L989 Disorder of the skin and subcutaneous tissue, unspecified: Secondary | ICD-10-CM

## 2020-05-26 DIAGNOSIS — M958 Other specified acquired deformities of musculoskeletal system: Secondary | ICD-10-CM | POA: Insufficient documentation

## 2020-05-26 DIAGNOSIS — Z1231 Encounter for screening mammogram for malignant neoplasm of breast: Secondary | ICD-10-CM

## 2020-05-26 DIAGNOSIS — Z23 Encounter for immunization: Secondary | ICD-10-CM

## 2020-05-26 DIAGNOSIS — E1165 Type 2 diabetes mellitus with hyperglycemia: Secondary | ICD-10-CM

## 2020-05-26 DIAGNOSIS — W19XXXA Unspecified fall, initial encounter: Secondary | ICD-10-CM | POA: Diagnosis not present

## 2020-05-26 DIAGNOSIS — I10 Essential (primary) hypertension: Secondary | ICD-10-CM | POA: Diagnosis not present

## 2020-05-26 LAB — CBC WITH DIFFERENTIAL/PLATELET
Basophils Absolute: 0.1 10*3/uL (ref 0.0–0.1)
Basophils Relative: 0.5 % (ref 0.0–3.0)
Eosinophils Absolute: 0.2 10*3/uL (ref 0.0–0.7)
Eosinophils Relative: 1.7 % (ref 0.0–5.0)
HCT: 40.4 % (ref 36.0–46.0)
Hemoglobin: 14.3 g/dL (ref 12.0–15.0)
Lymphocytes Relative: 25.8 % (ref 12.0–46.0)
Lymphs Abs: 2.6 10*3/uL (ref 0.7–4.0)
MCHC: 35.3 g/dL (ref 30.0–36.0)
MCV: 87.4 fl (ref 78.0–100.0)
Monocytes Absolute: 0.5 10*3/uL (ref 0.1–1.0)
Monocytes Relative: 5.3 % (ref 3.0–12.0)
Neutro Abs: 6.6 10*3/uL (ref 1.4–7.7)
Neutrophils Relative %: 66.7 % (ref 43.0–77.0)
Platelets: 317 10*3/uL (ref 150.0–400.0)
RBC: 4.62 Mil/uL (ref 3.87–5.11)
RDW: 13.1 % (ref 11.5–15.5)
WBC: 9.9 10*3/uL (ref 4.0–10.5)

## 2020-05-26 LAB — COMPREHENSIVE METABOLIC PANEL
ALT: 15 U/L (ref 0–35)
AST: 15 U/L (ref 0–37)
Albumin: 4.2 g/dL (ref 3.5–5.2)
Alkaline Phosphatase: 155 U/L — ABNORMAL HIGH (ref 39–117)
BUN: 11 mg/dL (ref 6–23)
CO2: 30 mEq/L (ref 19–32)
Calcium: 9.2 mg/dL (ref 8.4–10.5)
Chloride: 95 mEq/L — ABNORMAL LOW (ref 96–112)
Creatinine, Ser: 0.54 mg/dL (ref 0.40–1.20)
GFR: 101.22 mL/min (ref >60.00)
Glucose, Bld: 363 mg/dL — ABNORMAL HIGH (ref 70–99)
Potassium: 4.1 mEq/L (ref 3.5–5.1)
Sodium: 132 mEq/L — ABNORMAL LOW (ref 135–145)
Total Bilirubin: 0.6 mg/dL (ref 0.2–1.2)
Total Protein: 6.6 g/dL (ref 6.0–8.3)

## 2020-05-26 LAB — LIPID PANEL
Cholesterol: 190 mg/dL (ref 0–200)
HDL: 45.4 mg/dL (ref >39.00)
LDL Cholesterol: 118 mg/dL — ABNORMAL HIGH (ref 0–99)
NonHDL: 144.27
Total CHOL/HDL Ratio: 4
Triglycerides: 131 mg/dL (ref 0.0–149.0)
VLDL: 26.2 mg/dL (ref 0.0–40.0)

## 2020-05-26 LAB — T4, FREE: Free T4: 1.39 ng/dL (ref 0.60–1.60)

## 2020-05-26 LAB — HEMOGLOBIN A1C: Hgb A1c MFr Bld: 12.7 % — ABNORMAL HIGH (ref 4.6–6.5)

## 2020-05-26 LAB — TSH: TSH: 4.63 u[IU]/mL — ABNORMAL HIGH (ref 0.35–4.50)

## 2020-05-26 NOTE — Patient Instructions (Addendum)
For you wellness exam today I have ordered cbc, cmp, tsh, t4, a1c and  lipid panel.  Vaccine today flu vaccine.  Recommend exercise and healthy diet.  We will let you know lab results as they come in.  Follow up date appointment will be determined after lab review.   Left clavicle hypertension.  Blood pressure well controlled.  Continue lisinopril 5 mg daily.  Diabetes with high A1c in the past.  Please eat low sugar diet and will recheck A1c today.  I will see if additional medication is needed.  Also want to make sure we do have endocrinologist in our office that we could refer you to.  For recent fall yesterday, I do not think you need any imaging of your head or x-ray of your left shoulder.  No loss of consciousness and no gross motor/sensory function deficits.  Please be careful on the ice.  Skin lesion right temporal area  We will go ahead and refer you for skin cancer screening  Future mammogram order placed.  Mackie Pai, PA-C  Left clavicle deformity.  Just noticed area and no pain reported.  Will get x-ray.  History of hypothyroidism.  Will get TSH and T4.  Follow-up date to be determined after lab review.  Also keep regularly scheduled appointment with PCP.   Preventive Care 59-19 Years Old, Female Preventive care refers to lifestyle choices and visits with your health care provider that can promote health and wellness. This includes:  A yearly physical exam. This is also called an annual wellness visit.  Regular dental and eye exams.  Immunizations.  Screening for certain conditions.  Healthy lifestyle choices, such as: ? Eating a healthy diet. ? Getting regular exercise. ? Not using drugs or products that contain nicotine and tobacco. ? Limiting alcohol use. What can I expect for my preventive care visit? Physical exam Your health care provider will check your:  Height and weight. These may be used to calculate your BMI (body mass index). BMI is a  measurement that tells if you are at a healthy weight.  Heart rate and blood pressure.  Body temperature.  Skin for abnormal spots. Counseling Your health care provider may ask you questions about your:  Past medical problems.  Family's medical history.  Alcohol, tobacco, and drug use.  Emotional well-being.  Home life and relationship well-being.  Sexual activity.  Diet, exercise, and sleep habits.  Work and work Statistician.  Access to firearms.  Method of birth control.  Menstrual cycle.  Pregnancy history. What immunizations do I need? Vaccines are usually given at various ages, according to a schedule. Your health care provider will recommend vaccines for you based on your age, medical history, and lifestyle or other factors, such as travel or where you work.   What tests do I need? Blood tests  Lipid and cholesterol levels. These may be checked every 5 years, or more often if you are over 49 years old.  Hepatitis C test.  Hepatitis B test. Screening  Lung cancer screening. You may have this screening every year starting at age 26 if you have a 30-pack-year history of smoking and currently smoke or have quit within the past 15 years.  Colorectal cancer screening. ? All adults should have this screening starting at age 52 and continuing until age 36. ? Your health care provider may recommend screening at age 25 if you are at increased risk. ? You will have tests every 1-10 years, depending on your results and  the type of screening test.  Diabetes screening. ? This is done by checking your blood sugar (glucose) after you have not eaten for a while (fasting). ? You may have this done every 1-3 years.  Mammogram. ? This may be done every 1-2 years. ? Talk with your health care provider about when you should start having regular mammograms. This may depend on whether you have a family history of breast cancer.  BRCA-related cancer screening. This may be  done if you have a family history of breast, ovarian, tubal, or peritoneal cancers.  Pelvic exam and Pap test. ? This may be done every 3 years starting at age 4. ? Starting at age 84, this may be done every 5 years if you have a Pap test in combination with an HPV test. Other tests  STD (sexually transmitted disease) testing, if you are at risk.  Bone density scan. This is done to screen for osteoporosis. You may have this scan if you are at high risk for osteoporosis. Talk with your health care provider about your test results, treatment options, and if necessary, the need for more tests. Follow these instructions at home: Eating and drinking  Eat a diet that includes fresh fruits and vegetables, whole grains, lean protein, and low-fat dairy products.  Take vitamin and mineral supplements as recommended by your health care provider.  Do not drink alcohol if: ? Your health care provider tells you not to drink. ? You are pregnant, may be pregnant, or are planning to become pregnant.  If you drink alcohol: ? Limit how much you have to 0-1 drink a day. ? Be aware of how much alcohol is in your drink. In the [place] one drink equals one 12 oz bottle of beer (355 mL), one 5 oz glass of wine (148 mL), or one 1 oz glass of hard liquor (44 mL).   Lifestyle  Take daily care of your teeth and gums. Brush your teeth every morning and night with fluoride toothpaste. Floss one time each day.  Stay active. Exercise for at least 30 minutes 5 or more days each week.  Do not use any products that contain nicotine or tobacco, such as cigarettes, e-cigarettes, and chewing tobacco. If you need help quitting, ask your health care provider.  Do not use drugs.  If you are sexually active, practice safe sex. Use a condom or other form of protection to prevent STIs (sexually transmitted infections).  If you do not wish to become pregnant, use a form of birth control. If you plan to become pregnant, see  your health care provider for a prepregnancy visit.  If told by your health care provider, take low-dose aspirin daily starting at age 52.  Find healthy ways to cope with stress, such as: ? Meditation, yoga, or listening to music. ? Journaling. ? Talking to a trusted person. ? Spending time with friends and family. Safety  Always wear your seat belt while driving or riding in a vehicle.  Do not drive: ? If you have been drinking alcohol. Do not ride with someone who has been drinking. ? When you are tired or distracted. ? While texting.  Wear a helmet and other protective equipment during sports activities.  If you have firearms in your house, make sure you follow all gun safety procedures. What's next?  Visit your health care provider once a year for an annual wellness visit.  Ask your health care provider how often you should have your eyes and  teeth checked.  Stay up to date on all vaccines. This information is not intended to replace advice given to you by your health care provider. Make sure you discuss any questions you have with your health care provider. Document Revised: 01/27/2020 Document Reviewed: 01/03/2018 Elsevier Patient Education  2021 [employer].

## 2020-05-26 NOTE — Telephone Encounter (Signed)
Referral to endocrinologist placed. 

## 2020-05-26 NOTE — Progress Notes (Signed)
Subjective:    Patient ID: Colleen Baldwin, female    DOB: 1960/08/12, 60 y.o.   MRN: [id number]  HPI  Pt in for cpe/wellness exam.  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 Pt is on metformin 1000 mg twice daily. Pt was given glimepiride 2 mg twice daily but did not start. Pt admits poor diet.  Pt has htn. Controlled today. On lisinopril 5 mg daily.  Hx of high cholesterol. She was prescribribed atorvastatin 10 mg daily.   Pt has hx of low thyroid. On 200 mcg daily.  Pt has depression but well controlled with venflaxine. Not reporting anxiety.  Pt estimates colonscopy done in 2017 approximate. She will try to get [place] that report.  Pt is up to date on papsmear.   Pt had 2 covid vaccines.   Pt mentions left ear irritation. Pt thinks she accidentally put benadryl tablet when she was putting in ear plugs. She washe ear with peroxide. This happened a month ago still mild irritate.  Pt needs update mammgoram  Pt slipped on ice. Slight sore left shoulder but good rom. Pt bumped bck of her head. But no loc. No gross motor/sensory function defictis.   Review of Systems  Constitutional: Negative for chills, fatigue and fever.  HENT: Negative for congestion, ear discharge, facial swelling, mouth sores, nosebleeds and postnasal drip.   Respiratory: Negative for cough, chest tightness, shortness of breath and wheezing.   Cardiovascular: Negative for chest pain and palpitations.  Gastrointestinal: Negative for abdominal pain, constipation, diarrhea and nausea.  Musculoskeletal: Negative for back pain and myalgias.  Skin: Negative for rash.  Neurological: Negative for dizziness, speech difficulty, weakness, numbness and headaches.  Hematological: Negative for adenopathy. Does not bruise/bleed easily.  Psychiatric/Behavioral: Negative for behavioral problems, confusion and hallucinations. The patient is not nervous/anxious.     Past Medical History:   Diagnosis Date  . Anemia 06/10/2013  . Asthma   . Depression   . Diabetes mellitus    Type II  uncontrolled  . Dysplasia of cervix, low grade (CIN 1) 1992   Cryo   . HPV test positive, pap was normal 12/01/2015   genotype # 16/18/45 was negative.  Repeat pap 2018  . Hyperlipidemia   . Hypertension   . Hypothyroidism    s/p thyroidectomy for goiter  . Low back pain 03/07/2016  . 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 Personal history of colonic polyps 11/20/2012   Follows with [facility] [place]  . Preventative health care 11/20/2012  . TMJ dysfunction 06/10/2013     Social History   Socioeconomic History  . Marital status: Legally Separated    Spouse name: Not on file  . Number of children: 0  . Years of education: Not on file  . Highest education level: Not on file  Occupational History  . Occupation: INTERIOR DESIGNER    Employer: [employer]  Tobacco Use  . Smoking status: Never Smoker  . Smokeless tobacco: Never Used  Vaping Use  . Vaping Use: Never used  Substance and Sexual Activity  . Alcohol use: Yes    Comment: 0-1 a month  . Drug use: No  . Sexual activity: Not Currently    Partners: Male    Birth control/protection: Post-menopausal  Other Topics Concern  . Not on file  Social History Narrative  . Not on file   Social Determinants of Health   Financial Resource  Strain: Not on file  Food Insecurity: Not on file  Transportation Needs: Not on file  Physical Activity: Not on file  Stress: Not on file  Social Connections: Not on file  Intimate Partner Violence: Not on file    Past Surgical History:  Procedure Laterality Date  . CHOLECYSTECTOMY  05-2004  . IUD REMOVAL  07/20/2008   Of embedded paraguard  . LAPAROSCOPIC CHOLECYSTECTOMY  2006  . THYROIDECTOMY  09/1994  . TONSILLECTOMY AND ADENOIDECTOMY      Family History  Problem Relation Age of Onset  . Cancer Mother        ?uterine  . Diabetes Father   . Thyroid disease Father   .  Stroke Father   . Hypertension Sister   . Diabetes Sister   . Heart failure Maternal Grandfather   . Diabetes Paternal Grandmother   . Heart failure Paternal Grandfather   . Thyroid disease Sister   . Breast cancer Neg Hx     Allergies  Allergen Reactions  . Shrimp [Shellfish Allergy] Swelling    Current Outpatient Medications on File Prior to Visit  Medication Sig Dispense Refill  . aspirin 81 MG tablet Take 81 mg by mouth daily.    Marland Kitchen atorvastatin (LIPITOR) 10 MG tablet Take 1 tablet (10 mg total) by mouth daily. 90 tablet 0  . BLACK COHOSH PO Take by mouth.    . ferrous sulfate 325 (65 FE) MG tablet Take 325 mg by mouth every evening.    Marland Kitchen glimepiride (AMARYL) 2 MG tablet Take 1 tablet (2 mg total) by mouth in the morning and at bedtime. 180 tablet 0  . levothyroxine (SYNTHROID) 200 MCG tablet TAKE 1 TABLET BY MOUTH ONCE DAILY BEFORE BREAKFAST 90 tablet 0  . lisinopril (ZESTRIL) 5 MG tablet Take 1 tablet (5 mg total) by mouth daily. 90 tablet 0  . MELATONIN PO Take by mouth.    . metFORMIN (GLUCOPHAGE) 1000 MG tablet Take 1 tablet (1,000 mg total) by mouth 2 (two) times daily with a meal. 180 tablet 0  . potassium chloride (KLOR-CON M10) 10 MEQ tablet Take 1 tablet (10 mEq total) by mouth 2 (two) times daily. 180 tablet 0  . rosuvastatin (CRESTOR) 20 MG tablet Take 1 tablet by mouth once daily 90 tablet 0  . UNABLE TO FIND Calcium, mag, zinc takes 2 a day    . venlafaxine XR (EFFEXOR-XR) 75 MG 24 hr capsule Take 1 capsule (75 mg total) by mouth in the morning, at noon, and at bedtime. 270 capsule 0  . [DISCONTINUED] Diphenhydramine-APAP, sleep, (EXCEDRIN PM) 38-500 MG TABS Take 1 tablet by mouth at bedtime as needed.       No current facility-administered medications on file prior to visit.    BP 130/69 (BP Location: Right Arm, Cuff Size: Normal)   Pulse (!) 103   Temp 98.5 F (36.9 C) (Oral)   Resp 12   Ht 5\' 4"  (1.626 m)   Wt 155 lb 6.4 oz (70.5 kg)   LMP 02/15/2012    SpO2 100%   BMI 26.67 kg/m       Objective:   Physical Exam   General Mental Status- Alert. General Appearance- Not in acute distress.   Skin General: Color- Normal Color. Moisture- Normal Moisture.  Neck Carotid Arteries- Normal color. Moisture- Normal Moisture. No carotid bruits. No JVD.  Chest and Lung Exam Auscultation: Breath Sounds:-Normal.  Cardiovascular Auscultation:Rythm- Regular. Murmurs & Other Heart Sounds:Auscultation of the heart reveals- No Murmurs.  Abdomen Inspection:-Inspeection Normal. Palpation/Percussion:Note:No mass. Palpation and Percussion of the abdomen reveal- Non Tender, Non Distended + BS, no rebound or guarding.   Neurologic Cranial Nerve exam:- CN III-XII intact(No nystagmus), symmetric smile. Strength:- 5/5 equal and symmetric strength both upper and lower extremities.  Lower ext- feet normal. See quality metrics.     Assessment & Plan:  For you wellness exam today I have ordered cbc, cmp, tsh, t4, a1c and  lipid panel.  Vaccine today flu vaccine.  Recommend exercise and healthy diet.  We will let you know lab results as they come in.  Follow up date appointment will be determined after lab review.   Left clavicle hypertension.  Blood pressure well controlled.  Continue lisinopril 5 mg daily.  Diabetes with high A1c in the past.  Please eat low sugar diet and will recheck A1c today.  I will see if additional medication is needed.  Also want to make sure we do have endocrinologist in our office that we could refer you to.  For recent fall yesterday, I do not think you need any imaging of your head or x-ray of your left shoulder.  No loss of consciousness and no gross motor/sensory function deficits.  Please be careful on the ice.  Skin lesion right temporal area  We will go ahead and refer you for skin cancer screening  Future mammogram order placed.  Left clavicle deformity.  Just noticed area and no pain reported.  Will get  x-ray.  History of hypothyroidism.  Will get TSH and T4.  Follow-up date to be determined after lab review.  Also keep regularly scheduled appointment with PCP.   [phone] charge as well since addressed chronic conditions, recent fall, clavicle abnormality and skin lesion.  Mackie Pai, PA-C

## 2020-06-01 ENCOUNTER — Other Ambulatory Visit: Payer: Self-pay

## 2020-06-01 ENCOUNTER — Ambulatory Visit (HOSPITAL_BASED_OUTPATIENT_CLINIC_OR_DEPARTMENT_OTHER)
Admission: RE | Admit: 2020-06-01 | Discharge: 2020-06-01 | Disposition: A | Discharge status: Home / Self Care | Payer: PRIVATE HEALTH INSURANCE | Source: Ambulatory Visit | Attending: Medical | Admitting: Medical

## 2020-06-01 DIAGNOSIS — Z1231 Encounter for screening mammogram for malignant neoplasm of breast: Secondary | ICD-10-CM | POA: Insufficient documentation

## 2020-06-11 ENCOUNTER — Telehealth: Payer: Self-pay | Admitting: Family Medicine

## 2020-06-11 DIAGNOSIS — R7401 Elevation of levels of liver transaminase levels: Secondary | ICD-10-CM

## 2020-06-11 DIAGNOSIS — E1165 Type 2 diabetes mellitus with hyperglycemia: Secondary | ICD-10-CM

## 2020-06-11 DIAGNOSIS — E890 Postprocedural hypothyroidism: Secondary | ICD-10-CM

## 2020-06-11 DIAGNOSIS — R7402 Elevation of levels of lactic acid dehydrogenase (LDH): Secondary | ICD-10-CM

## 2020-06-11 DIAGNOSIS — I10 Essential (primary) hypertension: Secondary | ICD-10-CM

## 2020-06-11 DIAGNOSIS — IMO0002 Reserved for concepts with insufficient information to code with codable children: Secondary | ICD-10-CM

## 2020-06-11 NOTE — Telephone Encounter (Signed)
Medication venlafaxine XR (EFFEXOR-XR) 75 MG 24 hr capsule [[id number]]   metFORMIN (GLUCOPHAGE) 1000 MG tablet [[id number]]   lisinopril (ZESTRIL) 5 MG tablet [[id number]]   levothyroxine (SYNTHROID) 200 MCG tablet [[id number]]      Has the patient contacted their pharmacy? no (If no, request that the patient contact the pharmacy for the refill.) (If yes, when and what did the pharmacy advise?)    Preferred Pharmacy (with phone number or street name)   [facility] [address], [place]  [place], [place] [address]  Phone:  [phone] Fax:  [phone]     Agent: Please be advised that RX refills may take up to 3 business days. We ask that you follow-up with your pharmacy.

## 2020-06-14 MED ORDER — VENLAFAXINE HCL ER 75 MG PO CP24: 75.0000 mg | ORAL_CAPSULE | Freq: Three times a day (TID) | ORAL | 1 refills | Status: DC

## 2020-06-14 MED ORDER — LEVOTHYROXINE SODIUM 200 MCG PO TABS: 200.0000 ug | ORAL_TABLET | Freq: Every day | ORAL | 1 refills | Status: DC

## 2020-06-14 MED ORDER — METFORMIN HCL 1000 MG PO TABS: 1000.0000 mg | ORAL_TABLET | Freq: Two times a day (BID) | ORAL | 1 refills | Status: DC

## 2020-06-14 MED ORDER — LISINOPRIL 5 MG PO TABS: 5.0000 mg | ORAL_TABLET | Freq: Every day | ORAL | 1 refills | Status: DC

## 2020-06-14 NOTE — Telephone Encounter (Signed)
Prescriptions sent in.  Unable to leave message on vm.  Vm full.

## 2020-06-15 NOTE — Telephone Encounter (Signed)
No answer/vm full

## 2020-06-17 ENCOUNTER — Encounter: Payer: Self-pay | Admitting: Internal Medicine

## 2020-08-13 ENCOUNTER — Encounter: Payer: Self-pay | Admitting: Internal Medicine

## 2020-08-13 ENCOUNTER — Emergency Department (HOSPITAL_BASED_OUTPATIENT_CLINIC_OR_DEPARTMENT_OTHER)
Admission: EM | Admit: 2020-08-13 | Discharge: 2020-08-13 | Disposition: A | Discharge status: Home / Self Care | Payer: Worker's Compensation | Attending: Emergency Medicine | Admitting: Emergency Medicine

## 2020-08-13 ENCOUNTER — Ambulatory Visit (INDEPENDENT_AMBULATORY_CARE_PROVIDER_SITE_OTHER): Payer: PRIVATE HEALTH INSURANCE | Admitting: Internal Medicine

## 2020-08-13 ENCOUNTER — Encounter (HOSPITAL_BASED_OUTPATIENT_CLINIC_OR_DEPARTMENT_OTHER): Payer: Self-pay

## 2020-08-13 ENCOUNTER — Other Ambulatory Visit: Payer: Self-pay

## 2020-08-13 ENCOUNTER — Emergency Department (HOSPITAL_BASED_OUTPATIENT_CLINIC_OR_DEPARTMENT_OTHER): Payer: Worker's Compensation

## 2020-08-13 VITALS — BP 120/74 | HR 66 | Temp 97.6°F | Resp 16 | Ht 64.0 in | Wt 142.1 lb

## 2020-08-13 DIAGNOSIS — E039 Hypothyroidism, unspecified: Secondary | ICD-10-CM | POA: Insufficient documentation

## 2020-08-13 DIAGNOSIS — W19XXXD Unspecified fall, subsequent encounter: Secondary | ICD-10-CM

## 2020-08-13 DIAGNOSIS — Y990 Civilian activity done for income or pay: Secondary | ICD-10-CM | POA: Insufficient documentation

## 2020-08-13 DIAGNOSIS — F0781 Postconcussional syndrome: Secondary | ICD-10-CM | POA: Insufficient documentation

## 2020-08-13 DIAGNOSIS — S0990XD Unspecified injury of head, subsequent encounter: Secondary | ICD-10-CM

## 2020-08-13 DIAGNOSIS — E1165 Type 2 diabetes mellitus with hyperglycemia: Secondary | ICD-10-CM

## 2020-08-13 DIAGNOSIS — G44309 Post-traumatic headache, unspecified, not intractable: Secondary | ICD-10-CM | POA: Insufficient documentation

## 2020-08-13 DIAGNOSIS — Z79899 Other long term (current) drug therapy: Secondary | ICD-10-CM | POA: Insufficient documentation

## 2020-08-13 DIAGNOSIS — J45909 Unspecified asthma, uncomplicated: Secondary | ICD-10-CM | POA: Insufficient documentation

## 2020-08-13 DIAGNOSIS — R531 Weakness: Secondary | ICD-10-CM

## 2020-08-13 DIAGNOSIS — Z7984 Long term (current) use of oral hypoglycemic drugs: Secondary | ICD-10-CM | POA: Insufficient documentation

## 2020-08-13 DIAGNOSIS — Z7982 Long term (current) use of aspirin: Secondary | ICD-10-CM | POA: Insufficient documentation

## 2020-08-13 DIAGNOSIS — W01198D Fall on same level from slipping, tripping and stumbling with subsequent striking against other object, subsequent encounter: Secondary | ICD-10-CM | POA: Insufficient documentation

## 2020-08-13 DIAGNOSIS — S0003XD Contusion of scalp, subsequent encounter: Secondary | ICD-10-CM | POA: Insufficient documentation

## 2020-08-13 DIAGNOSIS — I10 Essential (primary) hypertension: Secondary | ICD-10-CM | POA: Insufficient documentation

## 2020-08-13 DIAGNOSIS — S0093XD Contusion of unspecified part of head, subsequent encounter: Secondary | ICD-10-CM

## 2020-08-13 LAB — POCT GLUCOSE (DEVICE FOR HOME USE): POC Glucose: 369 mg/dl — AB (ref 70–99)

## 2020-08-13 MED ORDER — GLIMEPIRIDE 2 MG PO TABS: 2.0000 mg | ORAL_TABLET | Freq: Two times a day (BID) | ORAL | 0 refills | Status: DC

## 2020-08-13 NOTE — Discharge Instructions (Addendum)
Like we discussed, I think that you likely are experiencing a postconcussion syndrome.  I have attached information on postconcussion syndrome.  Please reference this with any questions.  Please continue to monitor your symptoms closely.  If they worsen, please return to the emergency department.  Otherwise, if your symptoms persist please follow-up with your regular doctor.  It was a pleasure to meet you.

## 2020-08-13 NOTE — ED Provider Notes (Signed)
[facility] Provider Note   CSN: [id number] Arrival date & time: 08/13/20  1532     History Chief Complaint  Patient presents with  . Fall    Colleen Baldwin is a 60 y.o. female.  HPI Patient is a 60 year old female who presents the emergency department due to head pain.  Patient states that initially she fell 5 days ago.  She states that she tripped over a wheelchair wheel and fell backwards landing on her buttocks and then fell backwards and her posterior head struck a vinyl floor.  She states that she initially had a CT scan of the head which was negative.  During the week she continued to go to work and was having issues with her equilibrium, tingling in the upper extremities, and even notes an episode of weakness in the right arm where she dropped a drink.  No chest pain, shortness of breath, nausea, vomiting.  She does note a history of vertigo in the past with similar symptoms of disequilibrium.    Past Medical History:  Diagnosis Date  . Anemia 06/10/2013  . Asthma   . Depression   . Diabetes mellitus    Type II  uncontrolled  . Dysplasia of cervix, low grade (CIN 1) 1992   Cryo   . HPV test positive, pap was normal 12/01/2015   genotype # 16/18/45 was negative.  Repeat pap 2018  . Hyperlipidemia   . Hypertension   . Hypothyroidism    s/p thyroidectomy for goiter  . Low back pain 03/07/2016  . 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 Personal history of colonic polyps 11/20/2012   Follows with [facility] [place]  . Preventative health care 11/20/2012  . TMJ dysfunction 06/10/2013    Patient Active Problem List   Diagnosis Date Noted  . Left breast lump 10/10/2017  . Low back pain 03/07/2016  . Lump of breast, right 05/05/2015  . Numbness and tingling in right hand 10/24/2013  . Sun-damaged skin 10/24/2013  . Anemia 06/10/2013  . TMJ dysfunction 06/10/2013  . Preventative health care 11/20/2012  . Personal history of colonic polyps  11/20/2012  . Wrist pain 12/28/2011  . Anxiety and depression 09/23/2010  . FATTY LIVER DISEASE 05/12/2010  . Essential hypertension 12/06/2009  . TRANSAMINASES, SERUM, ELEVATED 05/13/2009  . Hypothyroidism 05/11/2009  . Diabetes type 2, uncontrolled ([place]) 05/11/2009  . Hyperlipidemia, mixed 05/11/2009  . Vitamin D deficiency 08/10/2008  . Type II or unspecified type diabetes mellitus without mention of complication, not stated as uncontrolled 08/05/2008    Past Surgical History:  Procedure Laterality Date  . CHOLECYSTECTOMY  05-2004  . IUD REMOVAL  07/20/2008   Of embedded paraguard  . LAPAROSCOPIC CHOLECYSTECTOMY  2006  . THYROIDECTOMY  09/1994  . TONSILLECTOMY AND ADENOIDECTOMY       OB History    Gravida  1   Para  0   Term  0   Preterm  0   AB  1   Living  0     SAB  0   IAB  1   Ectopic  0   Multiple  0   Live Births  0           Family History  Problem Relation Age of Onset  . Cancer Mother        ?uterine  . Diabetes Father   . Thyroid disease Father   . Stroke Father   . Hypertension Sister   .  Diabetes Sister   . Heart failure Maternal Grandfather   . Diabetes Paternal Grandmother   . Heart failure Paternal Grandfather   . Thyroid disease Sister   . Breast cancer Neg Hx     Social History   Tobacco Use  . Smoking status: Never Smoker  . Smokeless tobacco: Never Used  Vaping Use  . Vaping Use: Never used  Substance Use Topics  . Alcohol use: Yes    Comment: 0-1 a month  . Drug use: No    Home Medications Prior to Admission medications   Medication Sig Start Date End Date Taking? Authorizing Provider  aspirin 81 MG tablet Take 81 mg by mouth daily.    [provider]  atorvastatin (LIPITOR) 10 MG tablet Take 1 tablet (10 mg total) by mouth daily. 12/31/19   Mosie Lukes, MD  BLACK COHOSH PO Take by mouth.    [provider]  ferrous sulfate 325 (65 FE) MG tablet Take 325 mg by mouth every evening.     [provider]  glimepiride (AMARYL) 2 MG tablet Take 1 tablet (2 mg total) by mouth in the morning and at bedtime. 08/13/20   Colon Branch, MD  levothyroxine (SYNTHROID) 200 MCG tablet Take 1 tablet (200 mcg total) by mouth daily before breakfast. 06/14/20   Mosie Lukes, MD  lisinopril (ZESTRIL) 5 MG tablet Take 1 tablet (5 mg total) by mouth daily. 06/14/20   Mosie Lukes, MD  MELATONIN PO Take by mouth.    [provider]  metFORMIN (GLUCOPHAGE) 1000 MG tablet Take 1 tablet (1,000 mg total) by mouth 2 (two) times daily with a meal. 06/14/20   Mosie Lukes, MD  potassium chloride (KLOR-CON M10) 10 MEQ tablet Take 1 tablet (10 mEq total) by mouth 2 (two) times daily. 04/29/20   Mosie Lukes, MD  UNABLE TO FIND Calcium, mag, zinc takes 2 a day    [provider]  venlafaxine XR (EFFEXOR-XR) 75 MG 24 hr capsule Take 1 capsule (75 mg total) by mouth in the morning, at noon, and at bedtime. 06/14/20   Mosie Lukes, MD    Allergies    Shrimp [shellfish allergy]  Review of Systems   Review of Systems  All other systems reviewed and are negative. Ten systems reviewed and are negative for acute change, except as noted in the HPI.   Physical Exam Updated Vital Signs BP 139/82 (BP Location: Right Arm)   Pulse 97   Temp 98 F (36.7 C) (Oral)   Resp 18   LMP 02/15/2012   SpO2 100%   Physical Exam Vitals and nursing note reviewed.  Constitutional:      General: She is not in acute distress.    Appearance: Normal appearance. She is not ill-appearing, toxic-appearing or diaphoretic.  HENT:     Head: Normocephalic.     Comments: Large hematoma noted to the left posterior scalp.  Tenderness overlying the site.    Right Ear: External ear normal.     Left Ear: External ear normal.     Nose: Nose normal.     Mouth/Throat:     Mouth: Mucous membranes are moist.     Pharynx: Oropharynx is clear. No oropharyngeal exudate or posterior oropharyngeal erythema.  Eyes:      Extraocular Movements: Extraocular movements intact.  Neck:     Comments: No midline C, T, or L-spine tenderness.  Full range of motion of the cervical spine. Cardiovascular:  Rate and Rhythm: Normal rate and regular rhythm.     Pulses: Normal pulses.     Heart sounds: Normal heart sounds. No murmur heard. No friction rub. No gallop.   Pulmonary:     Effort: Pulmonary effort is normal. No respiratory distress.     Breath sounds: Normal breath sounds. No stridor. No wheezing, rhonchi or rales.  Abdominal:     General: Abdomen is flat.     Tenderness: There is no abdominal tenderness.  Musculoskeletal:        General: Normal range of motion.     Cervical back: Normal range of motion and neck supple. No tenderness.  Skin:    General: Skin is warm and dry.  Neurological:     General: No focal deficit present.     Mental Status: She is alert and oriented to person, place, and time.     Comments: Patient is oriented to person, place, and time. Patient phonates in clear, complete, and coherent sentences. Finger to nose intact bilaterally with no visible signs of dysmetria. Strength is 5/5 in all four extremities. Distal sensation intact in all four extremities.  Patient is able to stand and ambulate with a steady gait unassisted.  Psychiatric:        Mood and Affect: Mood normal.        Behavior: Behavior normal.    ED Results / Procedures / Treatments   Labs (all labs ordered are listed, but only abnormal results are displayed) Labs Reviewed - No data to display  EKG None  Radiology CT Head Wo Contrast  Result Date: 08/13/2020 CLINICAL DATA:  Status post fall several days ago with dizziness and upper extremity tingling. EXAM: CT HEAD WITHOUT CONTRAST TECHNIQUE: Contiguous axial images were obtained from the base of the skull through the vertex without intravenous contrast. COMPARISON:  None. FINDINGS: Brain: There is no evidence for acute hemorrhage, hydrocephalus, mass  lesion, or abnormal extra-axial fluid collection. No definite CT evidence for acute infarction. Vascular: No hyperdense vessel or unexpected calcification. Skull: No evidence for fracture. No worrisome lytic or sclerotic lesion. Sinuses/Orbits: No acute finding. Other: Scalp contusion noted high posterior left parietal region. IMPRESSION: 1. No acute intracranial abnormality. 2. Scalp contusion high posterior left parietal region. Electronically Signed   By: Misty Stanley M.D.   On: 08/13/2020 17:11   Procedures Procedures   Medications Ordered in ED Medications - No data to display  ED Course  I have reviewed the triage vital signs and the nursing notes.  Pertinent labs & imaging results that were available during my care of the patient were reviewed by me and considered in my medical decision making (see chart for details).    MDM Rules/Calculators/A&P                          Patient is a 60 year old female that presents with multiple symptoms after falling and hitting her head about 5 days ago at work.  Patient initially had a negative CT scan of the head after this occurred which was negative.  She was evaluated by her PCP today who recommended that she come back to the emergency department for a new scan of her head.  This was once again negative.  Her neurological exam today is benign.  Symptoms seem like they could possibly be secondary to a postconcussive syndrome.  I discussed these at length with the patient.  Recommended that she continue to monitor her symptoms.  She knows that if they worsen, she needs to return to the emergency department for immediate reevaluation.  Otherwise, I recommended that she monitor her symptoms and follow back up with her primary doctor next week.  Feel that she is stable for discharge at this time and she is agreeable.  Her questions were answered and she was amicable at the time of discharge.  Final Clinical Impression(s) / ED Diagnoses Final  diagnoses:  Fall, subsequent encounter  Injury of head, subsequent encounter  Post concussion syndrome   Rx / DC Orders ED Discharge Orders    None       Rayna Sexton, PA-C 08/13/20 1742    Blanchie Dessert, MD 08/13/20 2326

## 2020-08-13 NOTE — ED Provider Notes (Signed)
MSE was initiated and I personally evaluated the patient and placed orders (if any) at  3:42 PM on August 13, 2020.  The patient appears stable so that the remainder of the MSE may be completed by another provider.  Patient placed in Quick Look pathway, seen and evaluated   Chief Complaint: dizziness, tingling in upper extremities.  HPI:   60 y.o. F who had a fall on 08/08/20. She did not have any LOC. She is on ASA but no other blood thinners.  She reports that she had a CT scan initially after the fall that was reassuring.  She followed up with her primary care doctor today and noted she was having some dizziness, tingling to her upper extremities that she was advised in the emergency department have repeat CT scan.  ROS: dizziness, tingling   Physical Exam:   Gen: No distress  Neuro: Awake and Alert  Skin: Warm    Focused Exam: 5/5 strength BUE and BLE   Initiation of care has begun. The patient has been counseled on the process, plan, and necessity for staying for the completion/evaluation, and the remainder of the medical screening examination    Desma Mcgregor 08/13/20 1543    Blanchie Dessert, MD 08/13/20 2326

## 2020-08-13 NOTE — Patient Instructions (Addendum)
Go to ED.   Please make an appointment to see your primary doctor in 2 weeks  Restart glimepiride, check your blood sugars daily, bring the log  Eat 3 healthy meals a day  We will refer you to neurology

## 2020-08-13 NOTE — ED Triage Notes (Signed)
Pt states she fell at work 4/3-was seen 4/3 at [facility] by PCP today and sent to ED-c/o dizziness, tingling to bilat UE-right UE tingling worse-to triage in w/c-PA in for [facility]

## 2020-08-13 NOTE — Progress Notes (Signed)
Subjective:    Patient ID: Colleen Baldwin, female    DOB: 18-Mar-1961, 60 y.o.   MRN: [id number]  DOS:  08/13/2020 Type of visit - description: ER f/u   Went to the ER 5 days ago, she fell from 3 to 5 feet high, mechanical fall, no syncope, no bleeding, had a direct impact on the head, no LOC, no confusion.    At the ER BP was elevated, CT head without: No acute, left parietal hematoma. CBG was 437. EKG: NSR  Review of Systems She is here because since the fall 5 days ago she has experienced episodic right arm motor deficit. 2 days ago she attempted to go back to work and people told her that she was slurring some of her words.  Also she feels off balance from time to time.  She specifically denies a headache.  No nausea or vomiting No diplopia No neck pain. No shoulder or hip pain.   Past Medical History:  Diagnosis Date  . Anemia 06/10/2013  . Asthma   . Depression   . Diabetes mellitus    Type II  uncontrolled  . Dysplasia of cervix, low grade (CIN 1) 1992   Cryo   . HPV test positive, pap was normal 12/01/2015   genotype # 16/18/45 was negative.  Repeat pap 2018  . Hyperlipidemia   . Hypertension   . Hypothyroidism    s/p thyroidectomy for goiter  . Low back pain 03/07/2016  . 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 Personal history of colonic polyps 11/20/2012   Follows with [facility] in [place]  . Preventative health care 11/20/2012  . TMJ dysfunction 06/10/2013    Past Surgical History:  Procedure Laterality Date  . CHOLECYSTECTOMY  05-2004  . IUD REMOVAL  07/20/2008   Of embedded paraguard  . LAPAROSCOPIC CHOLECYSTECTOMY  2006  . THYROIDECTOMY  09/1994  . TONSILLECTOMY AND ADENOIDECTOMY      Allergies as of 08/13/2020      Reactions   Shrimp [shellfish Allergy] Swelling      Medication List       Accurate as of August 13, 2020 11:59 PM. If you have any questions, ask your nurse or doctor.        STOP taking these medications   Excedrin PM 38-500 MG  Tabs Generic drug: diphenhydrAMINE-APAP (sleep) Stopped by: Kathlene November, MD   rosuvastatin 20 MG tablet Commonly known as: CRESTOR Stopped by: Kathlene November, MD     TAKE these medications   aspirin 81 MG tablet Take 81 mg by mouth daily.   atorvastatin 10 MG tablet Commonly known as: LIPITOR Take 1 tablet (10 mg total) by mouth daily.   BLACK COHOSH PO Take by mouth.   ferrous sulfate 325 (65 FE) MG tablet Take 325 mg by mouth every evening.   glimepiride 2 MG tablet Commonly known as: AMARYL Take 1 tablet (2 mg total) by mouth in the morning and at bedtime.   levothyroxine 200 MCG tablet Commonly known as: SYNTHROID Take 1 tablet (200 mcg total) by mouth daily before breakfast.   lisinopril 5 MG tablet Commonly known as: ZESTRIL Take 1 tablet (5 mg total) by mouth daily.   MELATONIN PO Take by mouth.   metFORMIN 1000 MG tablet Commonly known as: GLUCOPHAGE Take 1 tablet (1,000 mg total) by mouth 2 (two) times daily with a meal.   potassium chloride 10 MEQ tablet Commonly known as: [place]-Con M10 Take 1 tablet (10 mEq total)  by mouth 2 (two) times daily.   UNABLE TO FIND Calcium, mag, zinc takes 2 a day   venlafaxine XR 75 MG 24 hr capsule Commonly known as: EFFEXOR-XR Take 1 capsule (75 mg total) by mouth in the morning, at noon, and at bedtime.          Objective:   Physical Exam BP 120/74 (BP Location: Left Arm, Patient Position: Sitting, Cuff Size: Small)   Pulse 66   Temp 97.6 F (36.4 C) (Oral)   Resp 16   Ht 5\' 4"  (1.626 m)   Wt 142 lb 2 oz (64.5 kg)   LMP 02/15/2012   SpO2 96%   BMI 24.40 kg/m  General:   Well developed, NAD, BMI noted. HEENT:  Normocephalic . Face symmetric, atraumatic Neck: Normal carotid pulses Lungs:  CTA B Normal respiratory effort, no intercostal retractions, no accessory muscle use. Heart: RRR,  no murmur.  Lower extremities: no pretibial edema bilaterally  Skin: Not pale. Not jaundice Neurologic:  alert &  oriented X3.  Speech normal, gait appropriate for age and unassisted. Motor and DTR symmetric EOMI, pupils equal and reactive. Psych--  Cognition and judgment appear intact.  Cooperative with normal attention span and concentration.  Behavior appropriate. No anxious or depressed appearing.      Assessment     60 year old female, PMH includes HTN, hypothyroidism, DM, high cholesterol, here for ER follow-up  Head contusion: The patient had a fall, went to the ER, had a head injury, CT head nonacute. Since then has noted occasional right arm weakness, some dizziness, has been told she had slur her words. Neurological exam today seems symmetric and nonacute. Nevertheless, I think we need to recheck a CT head today, will refer to neurology, likely has postconcussion symptoms. ER if severe neurological symptoms, she verbalized understanding. Addendum: Unable to get imaging today, recommend to go to the ER, I spoke with the ER MD who accept the patient.  Appreciate his help.Marland Kitchen  DM: CBG today 369, only had 3 pieces of candy to eat today.  On Metformin, not taking glimepiride as rx. Plan: Recommend healthy diet regularly, continue Metformin 1000 mg twice a day and restart glimepiride.  Check CBGs See PCP within 2 weeks noting that previous referral to endocrinology failed.  Time is spent 39 minutes reviewing the chart, coordinating patient care. This visit occurred during the SARS-CoV-2 public health emergency.  Safety protocols were in place, including screening questions prior to the visit, additional usage of staff PPE, and extensive cleaning of exam room while observing appropriate contact time as indicated for disinfecting solutions.

## 2020-08-13 NOTE — ED Notes (Signed)
Patient fell at work Sunday 4/3. Fell backward onto buttocks and hit back of head. Was seen in ED on 4/3. CT negative. Continues with equilibrium problems. Was seen by PCP this AM and sent to ED for follow-up.

## 2021-02-02 ENCOUNTER — Other Ambulatory Visit: Payer: Self-pay | Admitting: Family Medicine

## 2021-02-02 ENCOUNTER — Telehealth: Payer: Self-pay

## 2021-02-02 DIAGNOSIS — IMO0002 Reserved for concepts with insufficient information to code with codable children: Secondary | ICD-10-CM

## 2021-02-02 DIAGNOSIS — R7401 Elevation of levels of liver transaminase levels: Secondary | ICD-10-CM

## 2021-02-02 DIAGNOSIS — E1165 Type 2 diabetes mellitus with hyperglycemia: Secondary | ICD-10-CM

## 2021-02-02 DIAGNOSIS — E890 Postprocedural hypothyroidism: Secondary | ICD-10-CM

## 2021-02-02 DIAGNOSIS — I10 Essential (primary) hypertension: Secondary | ICD-10-CM

## 2021-02-02 NOTE — Telephone Encounter (Signed)
Called pt to get scheduled for appointment for medication refil

## 2021-02-02 NOTE — Telephone Encounter (Signed)
Called pt to schedule

## 2021-02-04 ENCOUNTER — Other Ambulatory Visit: Payer: Self-pay | Admitting: *Deleted

## 2021-02-04 MED ORDER — LISINOPRIL 5 MG PO TABS: 5.0000 mg | ORAL_TABLET | Freq: Every day | ORAL | 0 refills | Status: DC

## 2021-02-15 ENCOUNTER — Other Ambulatory Visit: Payer: Self-pay

## 2021-02-15 ENCOUNTER — Encounter: Payer: Self-pay | Admitting: Medical

## 2021-02-15 ENCOUNTER — Ambulatory Visit (INDEPENDENT_AMBULATORY_CARE_PROVIDER_SITE_OTHER): Payer: PRIVATE HEALTH INSURANCE | Admitting: Medical

## 2021-02-15 VITALS — BP 110/70 | HR 103 | Temp 98.1°F | Resp 18 | Ht 64.0 in | Wt 163.0 lb

## 2021-02-15 DIAGNOSIS — F419 Anxiety disorder, unspecified: Secondary | ICD-10-CM | POA: Diagnosis not present

## 2021-02-15 DIAGNOSIS — E1165 Type 2 diabetes mellitus with hyperglycemia: Secondary | ICD-10-CM | POA: Diagnosis not present

## 2021-02-15 DIAGNOSIS — E039 Hypothyroidism, unspecified: Secondary | ICD-10-CM

## 2021-02-15 DIAGNOSIS — I10 Essential (primary) hypertension: Secondary | ICD-10-CM | POA: Diagnosis not present

## 2021-02-15 DIAGNOSIS — L989 Disorder of the skin and subcutaneous tissue, unspecified: Secondary | ICD-10-CM

## 2021-02-15 DIAGNOSIS — F32A Depression, unspecified: Secondary | ICD-10-CM

## 2021-02-15 LAB — COMPREHENSIVE METABOLIC PANEL
ALT: 13 U/L (ref 0–35)
AST: 13 U/L (ref 0–37)
Albumin: 4.1 g/dL (ref 3.5–5.2)
Alkaline Phosphatase: 139 U/L — ABNORMAL HIGH (ref 39–117)
BUN: 12 mg/dL (ref 6–23)
CO2: 31 mEq/L (ref 19–32)
Calcium: 9.5 mg/dL (ref 8.4–10.5)
Chloride: 94 mEq/L — ABNORMAL LOW (ref 96–112)
Creatinine, Ser: 0.57 mg/dL (ref 0.40–1.20)
GFR: 99.4 mL/min (ref >60.00)
Glucose, Bld: 345 mg/dL — ABNORMAL HIGH (ref 70–99)
Potassium: 4.3 mEq/L (ref 3.5–5.1)
Sodium: 135 mEq/L (ref 135–145)
Total Bilirubin: 0.6 mg/dL (ref 0.2–1.2)
Total Protein: 7.1 g/dL (ref 6.0–8.3)

## 2021-02-15 LAB — T4, FREE: Free T4: 1.03 ng/dL (ref 0.60–1.60)

## 2021-02-15 LAB — LIPID PANEL
Cholesterol: 349 mg/dL — ABNORMAL HIGH (ref 0–200)
HDL: 57.3 mg/dL (ref >39.00)
NonHDL: 291.46
Total CHOL/HDL Ratio: 6
Triglycerides: 232 mg/dL — ABNORMAL HIGH (ref 0.0–149.0)
VLDL: 46.4 mg/dL — ABNORMAL HIGH (ref 0.0–40.0)

## 2021-02-15 LAB — LDL CHOLESTEROL, DIRECT: Direct LDL: 258 mg/dL

## 2021-02-15 LAB — TSH: TSH: 3.69 u[IU]/mL (ref 0.35–5.50)

## 2021-02-15 LAB — HEMOGLOBIN A1C: Hgb A1c MFr Bld: 12.6 % — ABNORMAL HIGH (ref 4.6–6.5)

## 2021-02-15 NOTE — Progress Notes (Signed)
Subjective:    Patient ID: Colleen Baldwin, female    DOB: 06/04/1960, 60 y.o.   MRN: [id number]  HPI  Pt in for check up on health conditions. Needs refills.   Pt states needs refill of her thyroid meds. January tsh was mild high but t4 was normal. Pt ran out of thyroid med today.   Pt has been on 200 mcg for very long time.  Hx of htn. Bp is mild high. Lisininopril 5 mg daily.   Diabetes- on metformin 1000 mg bid.on amaryl 2 mg daily.  On effexor and stable controlled depression.    Review of Systems  Constitutional:  Negative for chills, fatigue and fever.  Respiratory:  Negative for cough, chest tightness, shortness of breath and wheezing.   Cardiovascular:  Negative for chest pain and palpitations.  Gastrointestinal:  Negative for abdominal pain, constipation, nausea and vomiting.  Genitourinary:  Negative for difficulty urinating, dysuria and frequency.  Musculoskeletal:  Negative for back pain.  Skin:  Negative for rash.   Past Medical History:  Diagnosis Date   Anemia 06/10/2013   Asthma    Depression    Diabetes mellitus    Type II  uncontrolled   Dysplasia of cervix, low grade (CIN 1) 1992   Cryo    HPV test positive, pap was normal 12/01/2015   genotype # 16/18/45 was negative.  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    Personal history of colonic polyps 11/20/2012   Follows with [facility] in [place]   Preventative health care 11/20/2012   TMJ dysfunction 06/10/2013     Social History   Socioeconomic History   Marital status: Legally Separated    Spouse name: Not on file   Number of children: 0   Years of education: Not on file   Highest education level: Not on file  Occupational History   Occupation: INTERIOR DESIGNER    Employer: [employer]  Tobacco Use   Smoking status: Never   Smokeless tobacco: Never  Vaping  Use   Vaping Use: Never used  Substance and Sexual Activity   Alcohol use: Yes    Comment: 0-1 a month   Drug use: No   Sexual activity: Not Currently    Partners: Male    Birth control/protection: Post-menopausal  Other Topics Concern   Not on file  Social History Narrative   Not on file   Social Determinants of Health   Financial Resource Strain: Not on file  Food Insecurity: Not on file  Transportation Needs: Not on file  Physical Activity: Not on file  Stress: Not on file  Social Connections: Not on file  Intimate Partner Violence: Not on file    Past Surgical History:  Procedure Laterality Date   CHOLECYSTECTOMY  05-2004   IUD REMOVAL  07/20/2008   Of embedded paraguard   LAPAROSCOPIC CHOLECYSTECTOMY  2006   THYROIDECTOMY  09/1994   TONSILLECTOMY AND ADENOIDECTOMY      Family History  Problem Relation Age of Onset   Cancer Mother        ?uterine   Diabetes Father    Thyroid disease Father    Stroke Father    Hypertension Sister    Diabetes Sister    Heart failure Maternal Grandfather    Diabetes Paternal Grandmother    Heart failure Paternal  Grandfather    Thyroid disease Sister    Breast cancer Neg Hx     Allergies  Allergen Reactions   Shrimp [Shellfish Allergy] Swelling    Current Outpatient Medications on File Prior to Visit  Medication Sig Dispense Refill   aspirin 81 MG tablet Take 81 mg by mouth daily.     BLACK COHOSH PO Take by mouth.     ferrous sulfate 325 (65 FE) MG tablet Take 325 mg by mouth every evening.     glimepiride (AMARYL) 2 MG tablet Take 1 tablet (2 mg total) by mouth in the morning and at bedtime. 60 tablet 0   levothyroxine (SYNTHROID) 200 MCG tablet Take 1 tablet (200 mcg total) by mouth daily before breakfast. 90 tablet 1   lisinopril (ZESTRIL) 5 MG tablet Take 1 tablet (5 mg total) by mouth daily. 90 tablet 0   MELATONIN PO Take by mouth.     metFORMIN (GLUCOPHAGE) 1000 MG tablet Take 1 tablet (1,000 mg total) by mouth 2  (two) times daily with a meal. 180 tablet 1   potassium chloride (KLOR-CON M10) 10 MEQ tablet Take 1 tablet (10 mEq total) by mouth 2 (two) times daily. 180 tablet 0   UNABLE TO FIND Calcium, mag, zinc takes 2 a day     venlafaxine XR (EFFEXOR-XR) 75 MG 24 hr capsule Take 1 capsule (75 mg total) by mouth in the morning, at noon, and at bedtime. 270 capsule 1   No current facility-administered medications on file prior to visit.    BP (!) 146/80   Pulse (!) 103   Temp 98.1 F (36.7 C)   Resp 18   Ht 5\' 4"  (1.626 m)   Wt 163 lb (73.9 kg)   LMP 02/15/2012   SpO2 100%   BMI 27.98 kg/m        Objective:   Physical Exam  General Mental Status- Alert. General Appearance- Not in acute distress.   Skin General: Color- Normal Color. Moisture- Normal Moisture.  Neck Carotid Arteries- Normal color. Moisture- Normal Moisture. No carotid bruits. No JVD.  Chest and Lung Exam Auscultation: Breath Sounds:-Normal.  Cardiovascular Auscultation:Rythm- Regular. Murmurs & Other Heart Sounds:Auscultation of the heart reveals- No Murmurs.  Abdomen Inspection:-Inspeection Normal. Palpation/Percussion:Note:No mass. Palpation and Percussion of the abdomen reveal- Non Tender, Non Distended + BS, no rebound or guarding.   Neurologic Cranial Nerve exam:- CN III-XII intact(No nystagmus), symmetric smile. Strength:- 5/5 equal and symmetric strength both upper and lower extremities.   Skin-right temporal area small nonhealing scab.    Assessment & Plan:   Patient Instructions  Hypertension-well-controlled today.  Recheck to twice with tightly controlled readings.  Continue lisinopril 5 mg daily.  Diabetes.  High A1c last January.  Repeat F7P and metabolic panel today.  Continue metformin and Amaryl.  Might need to make adjustment to treatment regimen if again sugar averages very high.  Will include lipid panel with today's labs.  Anxiety and depression.  Stable presently.  Continue  Effexor.   Nonhealing skin lesion/scab right temporal area.  Placed referral again to dermatology.  If you having issues getting scheduled please let me know so I can notify referral staff.  Follow-up date to be determined after lab review.  As needed as well.   Mackie Pai, PA-C

## 2021-02-15 NOTE — Patient Instructions (Addendum)
Hypertension-well-controlled today.  Recheck to twice with tightly controlled readings.  Continue lisinopril 5 mg daily.  Diabetes.  High A1c very high  past January.  Repeat I3D and metabolic panel today.  Continue metformin and Amaryl.  Might need to make adjustment to treatment regimen if again sugar averages very high.  Will include lipid panel with today's labs.  Anxiety and depression.  Stable presently.  Continue Effexor.   Nonhealing skin lesion/scab right temporal area.  Placed referral again to dermatology.  If you having issues getting scheduled please let me know so I can notify referral staff.  Follow-up date to be determined after lab review.  As needed as well.

## 2021-02-16 ENCOUNTER — Telehealth: Payer: Self-pay | Admitting: Medical

## 2021-02-16 MED ORDER — LEVOTHYROXINE SODIUM 200 MCG PO TABS: 200.0000 ug | ORAL_TABLET | Freq: Every day | ORAL | 1 refills | Status: DC

## 2021-02-16 NOTE — Telephone Encounter (Signed)
Rx synthroid sent to pt pharmacy.

## 2021-02-17 MED ORDER — ATORVASTATIN CALCIUM 10 MG PO TABS: 10.0000 mg | ORAL_TABLET | Freq: Every day | ORAL | 11 refills | Status: DC

## 2021-02-17 NOTE — Addendum Note (Signed)
Addended by: Anabel Halon on: 02/17/2021 02:23 PM   Modules accepted: Orders

## 2021-03-30 ENCOUNTER — Other Ambulatory Visit: Payer: Self-pay | Admitting: Family Medicine

## 2021-03-30 DIAGNOSIS — R7402 Elevation of levels of lactic acid dehydrogenase (LDH): Secondary | ICD-10-CM

## 2021-03-30 DIAGNOSIS — R7401 Elevation of levels of liver transaminase levels: Secondary | ICD-10-CM

## 2021-03-30 DIAGNOSIS — E890 Postprocedural hypothyroidism: Secondary | ICD-10-CM

## 2021-03-30 DIAGNOSIS — I10 Essential (primary) hypertension: Secondary | ICD-10-CM

## 2021-05-17 ENCOUNTER — Encounter: Payer: Self-pay | Admitting: Internal Medicine

## 2021-05-18 ENCOUNTER — Telehealth: Payer: Self-pay

## 2021-05-18 NOTE — Telephone Encounter (Signed)
Caller needs to schedule an appt.  Telephone: [phone]

## 2021-05-18 NOTE — Telephone Encounter (Signed)
Mailbox full

## 2021-05-19 NOTE — Telephone Encounter (Signed)
Appt w/ Percell Miller scheduled 05/20/21.

## 2021-05-20 ENCOUNTER — Other Ambulatory Visit: Payer: Self-pay | Admitting: Family Medicine

## 2021-05-20 ENCOUNTER — Telehealth (INDEPENDENT_AMBULATORY_CARE_PROVIDER_SITE_OTHER): Payer: PRIVATE HEALTH INSURANCE | Admitting: Medical

## 2021-05-20 DIAGNOSIS — K219 Gastro-esophageal reflux disease without esophagitis: Secondary | ICD-10-CM

## 2021-05-20 MED ORDER — FAMOTIDINE 20 MG PO TABS: 20.0000 mg | ORAL_TABLET | Freq: Every day | ORAL | 0 refills | Status: AC

## 2021-05-20 NOTE — Progress Notes (Signed)
° °  Subjective:    Patient ID: Colleen Baldwin, female    DOB: 1960/06/14, 61 y.o.   MRN: [id number]  HPI Virtual Visit via Video Note  I connected with Colleen Baldwin on 05/20/21 at 11:20 AM EST by a video enabled telemedicine application and verified that I am speaking with the correct person using two identifiers.  Location: Patient:  [facility] in [place]. Provider: office   I discussed the limitations of evaluation and management by telemedicine and the availability of in person appointments. The patient expressed understanding and agreed to proceed.  No vitals were given today.  History of Present Illness: Pt in states she was at work last week. She felt like her stomach was upset and she was nausea. She vomited once at work. Coworker saw her and she missed some days as she needed to have note to return to work. Pt states she Tuesday wed and Thursday. She notes was drinking some sodas around time stomach got upset.  Pt has had her gallbladder removed in 2007. Pt describes that recently could feel acid come up into esophagus. Pt never got diarrhea.   Pt states know feeling completely better.   Describes had symptoms for total for 8 days. Symptoms were occurring after eating.   states initial onset after eating at restaurant at [place].   Observations/Objective: General-no acute distress, pleasant, oriented. Lungs- on inspection lungs appear unlabored. Neck- no tracheal deviation or jvd on inspection. Neuro- gross motor function appears intact.   Assessment and Plan:  Patient Instructions  Probable GERD versus food poisoning.  Presently recommend bland healthy diet as discussed.  Also making famotidine available if you start to have acid feeling with nausea again.  You do have recurrent signs and symptoms then would recommend an office evaluation and would probably include labs such as CBC, amylase, lipase and H. pylori breath test.  Follow-up as regular scheduled with PCP or sooner if  needed.   Mackie Pai, PA-C    Follow Up Instructions:    I discussed the assessment and treatment plan with the patient. The patient was provided an opportunity to ask questions and all were answered. The patient agreed with the plan and demonstrated an understanding of the instructions.   The patient was advised to call back or seek an in-person evaluation if the symptoms worsen or if the condition fails to improve as anticipated.  Time spent with patient today was 20  minutes which consisted of chart revdiew, discussing diagnosis, work up treatment and documentation.    Mackie Pai, PA-C    Review of Systems     Objective:   Physical Exam        Assessment & Plan:

## 2021-05-20 NOTE — Patient Instructions (Signed)
Probable GERD versus food poisoning.  Presently recommend bland healthy diet as discussed.  Also making famotidine available if you start to have acid feeling with nausea again.  You do have recurrent signs and symptoms then would recommend an office evaluation and would probably include labs such as CBC, amylase, lipase and H. pylori breath test.  Follow-up as regular scheduled with PCP or sooner if needed.

## 2021-09-12 ENCOUNTER — Telehealth: Payer: Self-pay

## 2021-09-12 NOTE — Telephone Encounter (Addendum)
Transition Care Management Unsuccessful Follow-up Telephone Call ? ?Date of discharge and from where:  [facility] [place] [facility] 09-10-21 Dx: Sepsis/ DKA ? ?Attempts:  1st Attempt ? ?Reason for unsuccessful TCM follow-up call:  Unable to leave message ? ? Transition Care Management Unsuccessful Follow-up Telephone Call ? ?Date of discharge and from where:  [place] [facility] 09-10-21 Dx: Sepsis/ DKA ? ?Attempts:  2nd Attempt ? ?Reason for unsuccessful TCM follow-up call:  Unable to leave message ? ?  ?

## 2021-09-16 ENCOUNTER — Other Ambulatory Visit: Payer: Self-pay | Admitting: Medical

## 2021-09-16 NOTE — Telephone Encounter (Signed)
Transition Care Management Unsuccessful Follow-up Telephone Call ? ?Date of discharge and from where:  Colleen Baldwin 09/10/21 - Sepsis/ DKA ? ?Attempts:  3rd Attempt ? ?Reason for unsuccessful TCM follow-up call:  Unable to reach patient ? ?  ?

## 2021-11-09 NOTE — Progress Notes (Deleted)
Subjective:    Patient ID: Colleen Baldwin, female    DOB: May 08, 1961, 61 y.o.   MRN: [id number]  No chief complaint on file.   HPI Patient is in today for a follow up and paperwork completion.  Past Medical History:  Diagnosis Date   Anemia 06/10/2013   Asthma    Depression    Diabetes mellitus    Type II  uncontrolled   Dysplasia of cervix, low grade (CIN 1) 1992   Cryo    HPV test positive, pap was normal 12/01/2015   genotype # 16/18/45 was negative.  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    Personal history of colonic polyps 11/20/2012   Follows with [facility] [place]   Preventative health care 11/20/2012   TMJ dysfunction 06/10/2013    Past Surgical History:  Procedure Laterality Date   CHOLECYSTECTOMY  05-2004   IUD REMOVAL  07/20/2008   Of embedded paraguard   LAPAROSCOPIC CHOLECYSTECTOMY  2006   THYROIDECTOMY  09/1994   TONSILLECTOMY AND ADENOIDECTOMY      Family History  Problem Relation Age of Onset   Cancer Mother        ?uterine   Diabetes Father    Thyroid disease Father    Stroke Father    Hypertension Sister    Diabetes Sister    Heart failure Maternal Grandfather    Diabetes Paternal Grandmother    Heart failure Paternal Grandfather    Thyroid disease Sister    Breast cancer Neg Hx     Social History   Socioeconomic History   Marital status: Legally Separated    Spouse name: Not on file   Number of children: 0   Years of education: Not on file   Highest education level: Not on file  Occupational History   Occupation: INTERIOR DESIGNER    Employer: [employer]  Tobacco Use   Smoking status: Never   Smokeless tobacco: Never  Vaping Use   Vaping Use: Never used  Substance and Sexual Activity   Alcohol use: Yes    Comment: 0-1 a month   Drug use: No   Sexual activity: Not Currently    Partners: Male     Birth control/protection: Post-menopausal  Other Topics Concern   Not on file  Social History Narrative   Not on file   Social Determinants of Health   Financial Resource Strain: Not on file  Food Insecurity: Not on file  Transportation Needs: Not on file  Physical Activity: Not on file  Stress: Not on file  Social Connections: Not on file  Intimate Partner Violence: Not on file    Outpatient Medications Prior to Visit  Medication Sig Dispense Refill   aspirin 81 MG tablet Take 81 mg by mouth daily.     atorvastatin (LIPITOR) 10 MG tablet Take 1 tablet (10 mg total) by mouth daily. 30 tablet 11   BLACK COHOSH PO Take by mouth.     famotidine (PEPCID) 20 MG tablet Take 1 tablet (20 mg total) by mouth daily. 30 tablet 0   ferrous sulfate 325 (65 FE) MG tablet Take 325 mg by mouth every evening.     glimepiride (AMARYL) 2 MG tablet Take 1 tablet (2 mg total) by mouth in the morning and at bedtime. 60 tablet 0   levothyroxine (SYNTHROID)  200 MCG tablet TAKE 1 TABLET BY MOUTH ONCE DAILY BEFORE BREAKFAST 90 tablet 0   lisinopril (ZESTRIL) 5 MG tablet Take 1 tablet by mouth once daily 90 tablet 0   MELATONIN PO Take by mouth.     metFORMIN (GLUCOPHAGE) 1000 MG tablet TAKE 1 TABLET BY MOUTH TWICE DAILY WITH  A  MEAL. 180 tablet 1   potassium chloride (KLOR-CON M10) 10 MEQ tablet Take 1 tablet (10 mEq total) by mouth 2 (two) times daily. 180 tablet 0   UNABLE TO FIND Calcium, mag, zinc takes 2 a day     venlafaxine XR (EFFEXOR-XR) 75 MG 24 hr capsule TAKE 1 CAPSULE BY MOUTH IN THE MORNING, AT NOON AND AT BEDTIME. 270 capsule 1   No facility-administered medications prior to visit.    Allergies  Allergen Reactions   Shrimp [Shellfish Allergy] Swelling    ROS     Objective:    Physical Exam  LMP 05/08/2008 (Approximate)  Wt Readings from Last 3 Encounters:  02/15/21 163 lb (73.9 kg)  08/13/20 142 lb 2 oz (64.5 kg)  05/26/20 155 lb 6.4 oz (70.5 kg)    Diabetic Foot Exam  - Simple   No data filed    Lab Results  Component Value Date   WBC 9.9 05/26/2020   HGB 14.3 05/26/2020   HCT 40.4 05/26/2020   PLT 317.0 05/26/2020   GLUCOSE 345 (H) 02/15/2021   CHOL 349 (H) 02/15/2021   TRIG 232.0 (H) 02/15/2021   HDL 57.30 02/15/2021   LDLDIRECT 258.0 02/15/2021   LDLCALC 118 (H) 05/26/2020   ALT 13 02/15/2021   AST 13 02/15/2021   NA 135 02/15/2021   K 4.3 02/15/2021   CL 94 (L) 02/15/2021   CREATININE 0.57 02/15/2021   BUN 12 02/15/2021   CO2 31 02/15/2021   TSH 3.69 02/15/2021   HGBA1C 12.6 (H) 02/15/2021   MICROALBUR 3.0 (H) 06/24/2014    Lab Results  Component Value Date   TSH 3.69 02/15/2021   Lab Results  Component Value Date   WBC 9.9 05/26/2020   HGB 14.3 05/26/2020   HCT 40.4 05/26/2020   MCV 87.4 05/26/2020   PLT 317.0 05/26/2020   Lab Results  Component Value Date   NA 135 02/15/2021   K 4.3 02/15/2021   CO2 31 02/15/2021   GLUCOSE 345 (H) 02/15/2021   BUN 12 02/15/2021   CREATININE 0.57 02/15/2021   BILITOT 0.6 02/15/2021   ALKPHOS 139 (H) 02/15/2021   AST 13 02/15/2021   ALT 13 02/15/2021   PROT 7.1 02/15/2021   ALBUMIN 4.1 02/15/2021   CALCIUM 9.5 02/15/2021   GFR 99.40 02/15/2021   Lab Results  Component Value Date   CHOL 349 (H) 02/15/2021   Lab Results  Component Value Date   HDL 57.30 02/15/2021   Lab Results  Component Value Date   LDLCALC 118 (H) 05/26/2020   Lab Results  Component Value Date   TRIG 232.0 (H) 02/15/2021   Lab Results  Component Value Date   CHOLHDL 6 02/15/2021   Lab Results  Component Value Date   HGBA1C 12.6 (H) 02/15/2021       Assessment & Plan:      Problem List Items Addressed This Visit       Endocrine   Diabetes type 2, uncontrolled - Primary    I am having Delsa Sale maintain her aspirin, ferrous sulfate, BLACK COHOSH PO, UNABLE TO FIND, MELATONIN PO, potassium chloride, glimepiride, atorvastatin,  venlafaxine XR, metFORMIN, lisinopril, famotidine,  and levothyroxine.  No orders of the defined types were placed in this encounter.

## 2021-11-10 ENCOUNTER — Ambulatory Visit (INDEPENDENT_AMBULATORY_CARE_PROVIDER_SITE_OTHER): Payer: PRIVATE HEALTH INSURANCE | Admitting: Family Medicine

## 2021-11-10 ENCOUNTER — Encounter: Payer: Self-pay | Admitting: Family Medicine

## 2021-11-10 VITALS — BP 118/84 | HR 100 | Resp 20 | Ht 64.0 in | Wt 153.0 lb

## 2021-11-10 DIAGNOSIS — E559 Vitamin D deficiency, unspecified: Secondary | ICD-10-CM

## 2021-11-10 DIAGNOSIS — R399 Unspecified symptoms and signs involving the genitourinary system: Secondary | ICD-10-CM | POA: Diagnosis not present

## 2021-11-10 DIAGNOSIS — R7401 Elevation of levels of liver transaminase levels: Secondary | ICD-10-CM | POA: Diagnosis not present

## 2021-11-10 DIAGNOSIS — R7402 Elevation of levels of lactic acid dehydrogenase (LDH): Secondary | ICD-10-CM

## 2021-11-10 DIAGNOSIS — E1165 Type 2 diabetes mellitus with hyperglycemia: Secondary | ICD-10-CM

## 2021-11-10 DIAGNOSIS — E890 Postprocedural hypothyroidism: Secondary | ICD-10-CM

## 2021-11-10 DIAGNOSIS — I10 Essential (primary) hypertension: Secondary | ICD-10-CM | POA: Diagnosis not present

## 2021-11-10 DIAGNOSIS — E1310 Other specified diabetes mellitus with ketoacidosis without coma: Secondary | ICD-10-CM

## 2021-11-10 DIAGNOSIS — E782 Mixed hyperlipidemia: Secondary | ICD-10-CM

## 2021-11-10 LAB — URINALYSIS, ROUTINE W REFLEX MICROSCOPIC
Bilirubin Urine: NEGATIVE
Ketones, ur: NEGATIVE
Nitrite: NEGATIVE
RBC / HPF: NONE SEEN (ref 0–?)
Specific Gravity, Urine: <=1.005 — AB (ref 1.000–1.030)
Total Protein, Urine: NEGATIVE
Urine Glucose: >=1000 — AB
Urobilinogen, UA: 0.2 (ref 0.0–1.0)
pH: 6.5 (ref 5.0–8.0)

## 2021-11-10 MED ORDER — VENLAFAXINE HCL ER 75 MG PO CP24: ORAL_CAPSULE | ORAL | 1 refills | Status: AC

## 2021-11-10 MED ORDER — LISINOPRIL 5 MG PO TABS: 5.0000 mg | ORAL_TABLET | Freq: Every day | ORAL | 1 refills | Status: AC

## 2021-11-10 MED ORDER — LEVOTHYROXINE SODIUM 200 MCG PO TABS: 200.0000 ug | ORAL_TABLET | Freq: Every day | ORAL | 1 refills | Status: AC

## 2021-11-10 MED ORDER — ATORVASTATIN CALCIUM 10 MG PO TABS: 10.0000 mg | ORAL_TABLET | Freq: Every day | ORAL | 1 refills | Status: AC

## 2021-11-10 MED ORDER — METFORMIN HCL 1000 MG PO TABS: 1000.0000 mg | ORAL_TABLET | Freq: Two times a day (BID) | ORAL | 1 refills | Status: AC

## 2021-11-10 NOTE — Progress Notes (Incomplete)
Subjective:   By signing my name below, I, Kellie Simmering, attest that this documentation has been prepared under the direction and in the presence of '@ENCPROV'$ @. '@ENCDATE'$ @     Patient ID: Colleen Baldwin, female    DOB: 08-14-1960, 61 y.o.   MRN: [id number]  No chief complaint on file.   HPI Patient is in today for ***  Past Medical History:  Diagnosis Date   Anemia 06/10/2013   Asthma    Depression    Diabetes mellitus    Type II  uncontrolled   Dysplasia of cervix, low grade (CIN 1) 1992   Cryo    HPV test positive, pap was normal 12/01/2015   genotype # 16/18/45 was negative.  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    Personal history of colonic polyps 11/20/2012   Follows with [facility] in [place]   Preventative health care 11/20/2012   TMJ dysfunction 06/10/2013    Past Surgical History:  Procedure Laterality Date   CHOLECYSTECTOMY  05-2004   IUD REMOVAL  07/20/2008   Of embedded paraguard   LAPAROSCOPIC CHOLECYSTECTOMY  2006   THYROIDECTOMY  09/1994   TONSILLECTOMY AND ADENOIDECTOMY      Family History  Problem Relation Age of Onset   Cancer Mother        ?uterine   Diabetes Father    Thyroid disease Father    Stroke Father    Hypertension Sister    Diabetes Sister    Heart failure Maternal Grandfather    Diabetes Paternal Grandmother    Heart failure Paternal Grandfather    Thyroid disease Sister    Breast cancer Neg Hx     Social History   Socioeconomic History   Marital status: Legally Separated    Spouse name: Not on file   Number of children: 0   Years of education: Not on file   Highest education level: Not on file  Occupational History   Occupation: INTERIOR DESIGNER    Employer: [employer]  Tobacco Use   Smoking status: Never   Smokeless tobacco: Never  Vaping Use   Vaping Use: Never used  Substance and  Sexual Activity   Alcohol use: Yes    Comment: 0-1 a month   Drug use: No   Sexual activity: Not Currently    Partners: Male    Birth control/protection: Post-menopausal  Other Topics Concern   Not on file  Social History Narrative   Not on file   Social Determinants of Health   Financial Resource Strain: Not on file  Food Insecurity: Not on file  Transportation Needs: Not on file  Physical Activity: Not on file  Stress: Not on file  Social Connections: Not on file  Intimate Partner Violence: Not on file    Outpatient Medications Prior to Visit  Medication Sig Dispense Refill   aspirin 81 MG tablet Take 81 mg by mouth daily.     atorvastatin (LIPITOR) 10 MG tablet Take 1 tablet (10 mg total) by mouth daily. 30 tablet 11   BLACK COHOSH PO Take by mouth.     famotidine (PEPCID) 20 MG tablet Take 1 tablet (20 mg total) by mouth daily. 30 tablet 0   ferrous sulfate 325 (65 FE) MG tablet Take 325 mg by mouth every evening.     glimepiride (AMARYL) 2  MG tablet Take 1 tablet (2 mg total) by mouth in the morning and at bedtime. 60 tablet 0   levothyroxine (SYNTHROID) 200 MCG tablet TAKE 1 TABLET BY MOUTH ONCE DAILY BEFORE BREAKFAST 90 tablet 0   lisinopril (ZESTRIL) 5 MG tablet Take 1 tablet by mouth once daily 90 tablet 0   MELATONIN PO Take by mouth.     metFORMIN (GLUCOPHAGE) 1000 MG tablet TAKE 1 TABLET BY MOUTH TWICE DAILY WITH  A  MEAL. 180 tablet 1   potassium chloride (KLOR-CON M10) 10 MEQ tablet Take 1 tablet (10 mEq total) by mouth 2 (two) times daily. 180 tablet 0   UNABLE TO FIND Calcium, mag, zinc takes 2 a day     venlafaxine XR (EFFEXOR-XR) 75 MG 24 hr capsule TAKE 1 CAPSULE BY MOUTH IN THE MORNING, AT NOON AND AT BEDTIME. 270 capsule 1   No facility-administered medications prior to visit.    Allergies  Allergen Reactions   Shrimp [Shellfish Allergy] Swelling    ROS     Objective:    Physical Exam  LMP 05/08/2008 (Approximate)  Wt Readings from Last 3  Encounters:  02/15/21 163 lb (73.9 kg)  08/13/20 142 lb 2 oz (64.5 kg)  05/26/20 155 lb 6.4 oz (70.5 kg)    Diabetic Foot Exam - Simple   No data filed    Lab Results  Component Value Date   WBC 9.9 05/26/2020   HGB 14.3 05/26/2020   HCT 40.4 05/26/2020   PLT 317.0 05/26/2020   GLUCOSE 345 (H) 02/15/2021   CHOL 349 (H) 02/15/2021   TRIG 232.0 (H) 02/15/2021   HDL 57.30 02/15/2021   LDLDIRECT 258.0 02/15/2021   LDLCALC 118 (H) 05/26/2020   ALT 13 02/15/2021   AST 13 02/15/2021   NA 135 02/15/2021   K 4.3 02/15/2021   CL 94 (L) 02/15/2021   CREATININE 0.57 02/15/2021   BUN 12 02/15/2021   CO2 31 02/15/2021   TSH 3.69 02/15/2021   HGBA1C 12.6 (H) 02/15/2021   MICROALBUR 3.0 (H) 06/24/2014    Lab Results  Component Value Date   TSH 3.69 02/15/2021   Lab Results  Component Value Date   WBC 9.9 05/26/2020   HGB 14.3 05/26/2020   HCT 40.4 05/26/2020   MCV 87.4 05/26/2020   PLT 317.0 05/26/2020   Lab Results  Component Value Date   NA 135 02/15/2021   K 4.3 02/15/2021   CO2 31 02/15/2021   GLUCOSE 345 (H) 02/15/2021   BUN 12 02/15/2021   CREATININE 0.57 02/15/2021   BILITOT 0.6 02/15/2021   ALKPHOS 139 (H) 02/15/2021   AST 13 02/15/2021   ALT 13 02/15/2021   PROT 7.1 02/15/2021   ALBUMIN 4.1 02/15/2021   CALCIUM 9.5 02/15/2021   GFR 99.40 02/15/2021   Lab Results  Component Value Date   CHOL 349 (H) 02/15/2021   Lab Results  Component Value Date   HDL 57.30 02/15/2021   Lab Results  Component Value Date   LDLCALC 118 (H) 05/26/2020   Lab Results  Component Value Date   TRIG 232.0 (H) 02/15/2021   Lab Results  Component Value Date   CHOLHDL 6 02/15/2021   Lab Results  Component Value Date   HGBA1C 12.6 (H) 02/15/2021       Assessment & Plan:   Problem List Items Addressed This Visit       Cardiovascular and Mediastinum   Essential hypertension     Endocrine   Diabetes  type 2, uncontrolled - Primary    '@ENCMEDP'$ @  No orders  of the defined types were placed in this encounter.   I, Kellie Simmering, personally preformed the services described in this documentation.  All medical record entries made by the scribe were at my direction and in my presence.  I have reviewed the chart and discharge instructions (if applicable) and agree that the record reflects my personal performance and is accurate and complete. '@ENCDATE'$ @     Liz Claiborne

## 2021-11-10 NOTE — Patient Instructions (Addendum)
Shingrix is the new shingles shot, 2 shots over 2-6 months, confirm coverage with insurance and document, then can return here for shots with nurse appt or at [facility] well with 60-80 ounces of fluids daily  DASH or MIND or Mediterranean diet  Exercise frequently  Carbohydrate Counting for Diabetes Mellitus, Adult Carbohydrate counting is a method of keeping track of how many carbohydrates you eat. Eating carbohydrates increases the amount of sugar (glucose) in the blood. Counting how many carbohydrates you eat improves how well you manage your blood glucose. This, in turn, helps you manage your diabetes. Carbohydrates are measured in grams (g) per serving. It is important to know how many carbohydrates (in grams or by serving size) you can have in each meal. This is different for every person. A dietitian can help you make a meal plan and calculate how many carbohydrates you should have at each meal and snack. What foods contain carbohydrates? Carbohydrates are found in the following foods: Grains, such as breads and cereals. Dried beans and soy products. Starchy vegetables, such as potatoes, peas, and corn. Fruit and fruit juices. Milk and yogurt. Sweets and snack foods, such as cake, cookies, candy, chips, and soft drinks. How do I count carbohydrates in foods? There are two ways to count carbohydrates in food. You can read food labels or learn standard serving sizes of foods. You can use either of these methods or a combination of both. Using the Nutrition Facts label The Nutrition Facts list is included on the labels of almost all packaged foods and beverages in the [place]. It includes: The serving size. Information about nutrients in each serving, including the grams of carbohydrate per serving. To use the Nutrition Facts, decide how many servings you will have. Then, multiply the number of servings by the number of carbohydrates per serving. The resulting number is  the total grams of carbohydrates that you will be having. Learning the standard serving sizes of foods When you eat carbohydrate foods that are not packaged or do not include Nutrition Facts on the label, you need to measure the servings in order to count the grams of carbohydrates. Measure the foods that you will eat with a food scale or measuring cup, if needed. Decide how many standard-size servings you will eat. Multiply the number of servings by 15. For foods that contain carbohydrates, one serving equals 15 g of carbohydrates. For example, if you eat 2 cups or 10 oz (300 g) of strawberries, you will have eaten 2 servings and 30 g of carbohydrates (2 servings x 15 g = 30 g). For foods that have more than one food mixed, such as soups and casseroles, you must count the carbohydrates in each food that is included. The following list contains standard serving sizes of common carbohydrate-rich foods. Each of these servings has about 15 g of carbohydrates: 1 slice of bread. 1 six-inch (15 cm) tortilla. ? cup or 2 oz (53 g) cooked rice or pasta.  cup or 3 oz (85 g) cooked or canned, drained and rinsed beans or lentils.  cup or 3 oz (85 g) starchy vegetable, such as peas, corn, or squash.  cup or 4 oz (120 g) hot cereal.  cup or 3 oz (85 g) boiled or mashed potatoes, or  or 3 oz (85 g) of a large baked potato.  cup or 4 fl oz (118 mL) fruit juice. 1 cup or 8 fl oz (237 mL) milk. 1 small or 4 oz (  106 g) apple.  or 2 oz (63 g) of a medium banana. 1 cup or 5 oz (150 g) strawberries. 3 cups or 1 oz (28.3 g) popped popcorn. What is an example of carbohydrate counting? To calculate the grams of carbohydrates in this sample meal, follow the steps shown below. Sample meal 3 oz (85 g) chicken breast. ? cup or 4 oz (106 g) brown rice.  cup or 3 oz (85 g) corn. 1 cup or 8 fl oz (237 mL) milk. 1 cup or 5 oz (150 g) strawberries with sugar-free whipped topping. Carbohydrate  calculation Identify the foods that contain carbohydrates: Rice. Corn. Milk. Strawberries. Calculate how many servings you have of each food: 2 servings rice. 1 serving corn. 1 serving milk. 1 serving strawberries. Multiply each number of servings by 15 g: 2 servings rice x 15 g = 30 g. 1 serving corn x 15 g = 15 g. 1 serving milk x 15 g = 15 g. 1 serving strawberries x 15 g = 15 g. Add together all of the amounts to find the total grams of carbohydrates eaten: 30 g + 15 g + 15 g + 15 g = 75 g of carbohydrates total. What are tips for following this plan? Shopping Develop a meal plan and then make a shopping list. Buy fresh and frozen vegetables, fresh and frozen fruit, dairy, eggs, beans, lentils, and whole grains. Look at food labels. Choose foods that have more fiber and less sugar. Avoid processed foods and foods with added sugars. Meal planning Aim to have the same number of grams of carbohydrates at each meal and for each snack time. Plan to have regular, balanced meals and snacks. Where to find more information American Diabetes Association: diabetes.org Centers for Disease Control and Prevention: [employer].com.[place] Academy of Nutrition and Dietetics: eatright.org Association of Diabetes Care & Education Specialists: diabeteseducator.org Summary Carbohydrate counting is a method of keeping track of how many carbohydrates you eat. Eating carbohydrates increases the amount of sugar (glucose) in your blood. Counting how many carbohydrates you eat improves how well you manage your blood glucose. This helps you manage your diabetes. A dietitian can help you make a meal plan and calculate how many carbohydrates you should have at each meal and snack. This information is not intended to replace advice given to you by your health care provider. Make sure you discuss any questions you have with your health care provider. Document Revised: 11/26/2019 Document Reviewed: 11/26/2019 Elsevier  Patient Education  [place].

## 2021-11-11 DIAGNOSIS — R399 Unspecified symptoms and signs involving the genitourinary system: Secondary | ICD-10-CM | POA: Insufficient documentation

## 2021-11-11 DIAGNOSIS — E1110 Type 2 diabetes mellitus with ketoacidosis without coma: Secondary | ICD-10-CM | POA: Insufficient documentation

## 2021-11-11 LAB — URINE CULTURE
MICRO NUMBER:: 13612097
SPECIMEN QUALITY:: ADEQUATE

## 2021-11-11 NOTE — Assessment & Plan Note (Signed)
Was hospitalized in April with DKA and is here today to complete paperwork for her ST disability related to that episode. She went back to work in May but has since retired. Paperwork completed.

## 2021-11-11 NOTE — Assessment & Plan Note (Signed)
Well controlled, no changes to meds. Encouraged heart healthy diet such as the DASH diet and exercise as tolerated.  °

## 2021-11-11 NOTE — Assessment & Plan Note (Signed)
Tolerating statin, encouraged heart healthy diet, avoid trans fats, minimize simple carbs and saturated fats. Increase exercise as tolerated 

## 2021-11-11 NOTE — Progress Notes (Signed)
Subjective:    Patient ID: Colleen Baldwin, female    DOB: 05-09-1960, 61 y.o.   MRN: [id number]  Chief Complaint  Patient presents with   Follow-up    HPI Patient is in today for follow up on chronic medical concerns and to complete disability paperwork related to her hospitalization for DKA in April. She went back to work in May but has since retired. She is doing well at this time. She does note some fatigue. Denies CP/palp/SOB/HA/congestion/fevers/GI or GU c/o. Taking meds as prescribed   Past Medical History:  Diagnosis Date   Anemia 06/10/2013   Asthma    Depression    Diabetes mellitus    Type II  uncontrolled   Dysplasia of cervix, low grade (CIN 1) 1992   Cryo    HPV test positive, pap was normal 12/01/2015   genotype # 16/18/45 was negative.  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    Personal history of colonic polyps 11/20/2012   Follows with [facility] in [place]   Preventative health care 11/20/2012   TMJ dysfunction 06/10/2013    Past Surgical History:  Procedure Laterality Date   CHOLECYSTECTOMY  05-2004   IUD REMOVAL  07/20/2008   Of embedded paraguard   LAPAROSCOPIC CHOLECYSTECTOMY  2006   THYROIDECTOMY  09/1994   TONSILLECTOMY AND ADENOIDECTOMY      Family History  Problem Relation Age of Onset   Cancer Mother        ?uterine   Diabetes Father    Thyroid disease Father    Stroke Father    Hypertension Sister    Diabetes Sister    Heart failure Maternal Grandfather    Diabetes Paternal Grandmother    Heart failure Paternal Grandfather    Thyroid disease Sister    Breast cancer Neg Hx     Social History   Socioeconomic History   Marital status: Legally Separated    Spouse name: Not on file   Number of children: 0   Years of education: Not on file   Highest education level: Not on file  Occupational History   Occupation:  INTERIOR DESIGNER    Employer: [employer]  Tobacco Use   Smoking status: Never   Smokeless tobacco: Never  Vaping Use   Vaping Use: Never used  Substance and Sexual Activity   Alcohol use: Yes    Comment: 0-1 a month   Drug use: No   Sexual activity: Not Currently    Partners: Male    Birth control/protection: Post-menopausal  Other Topics Concern   Not on file  Social History Narrative   Not on file   Social Determinants of Health   Financial Resource Strain: Not on file  Food Insecurity: Not on file  Transportation Needs: Not on file  Physical Activity: Not on file  Stress: Not on file  Social Connections: Not on file  Intimate Partner Violence: Not on file    Outpatient Medications Prior to Visit  Medication Sig Dispense Refill   aspirin 81 MG tablet Take 81 mg by mouth daily.     BLACK COHOSH PO Take by mouth.     famotidine (PEPCID) 20 MG tablet Take 1 tablet (20 mg total) by mouth daily. 30 tablet 0   ferrous sulfate 325 (65 FE) MG tablet Take 325 mg by mouth  every evening.     MELATONIN PO Take by mouth.     potassium chloride (KLOR-CON M10) 10 MEQ tablet Take 1 tablet (10 mEq total) by mouth 2 (two) times daily. 180 tablet 0   UNABLE TO FIND Calcium, mag, zinc takes 2 a day     atorvastatin (LIPITOR) 10 MG tablet Take 1 tablet (10 mg total) by mouth daily. 30 tablet 11   glimepiride (AMARYL) 2 MG tablet Take 1 tablet (2 mg total) by mouth in the morning and at bedtime. 60 tablet 0   levothyroxine (SYNTHROID) 200 MCG tablet TAKE 1 TABLET BY MOUTH ONCE DAILY BEFORE BREAKFAST 90 tablet 0   lisinopril (ZESTRIL) 5 MG tablet Take 1 tablet by mouth once daily 90 tablet 0   metFORMIN (GLUCOPHAGE) 1000 MG tablet TAKE 1 TABLET BY MOUTH TWICE DAILY WITH  A  MEAL. 180 tablet 1   venlafaxine XR (EFFEXOR-XR) 75 MG 24 hr capsule TAKE 1 CAPSULE BY MOUTH IN THE MORNING, AT NOON AND AT BEDTIME. 270 capsule 1   No facility-administered medications prior to visit.     Allergies  Allergen Reactions   Shrimp [Shellfish Allergy] Swelling    Review of Systems  Constitutional:  Negative for fever and malaise/fatigue.  HENT:  Negative for congestion.   Eyes:  Negative for blurred vision.  Respiratory:  Negative for shortness of breath.   Cardiovascular:  Negative for chest pain, palpitations and leg swelling.  Gastrointestinal:  Negative for abdominal pain, blood in stool and nausea.  Genitourinary:  Negative for dysuria and frequency.  Musculoskeletal:  Negative for falls.  Skin:  Negative for rash.  Neurological:  Negative for dizziness, loss of consciousness and headaches.  Endo/Heme/Allergies:  Negative for environmental allergies.  Psychiatric/Behavioral:  Negative for depression. The patient is not nervous/anxious.        Objective:    Physical Exam Constitutional:      General: She is not in acute distress.    Appearance: She is well-developed.  HENT:     Head: Normocephalic and atraumatic.  Eyes:     Conjunctiva/sclera: Conjunctivae normal.  Neck:     Thyroid: No thyromegaly.  Cardiovascular:     Rate and Rhythm: Normal rate and regular rhythm.     Heart sounds: Normal heart sounds. No murmur heard. Pulmonary:     Effort: Pulmonary effort is normal. No respiratory distress.     Breath sounds: Normal breath sounds.  Abdominal:     General: Bowel sounds are normal. There is no distension.     Palpations: Abdomen is soft. There is no mass.     Tenderness: There is no abdominal tenderness.  Musculoskeletal:     Cervical back: Neck supple.  Lymphadenopathy:     Cervical: No cervical adenopathy.  Skin:    General: Skin is warm and dry.  Neurological:     Mental Status: She is alert and oriented to person, place, and time.  Psychiatric:        Behavior: Behavior normal.     BP 118/84 (BP Location: Left Arm, Patient Position: Sitting, Cuff Size: Normal)   Pulse 100   Resp 20   Ht '5\' 4"'$  (1.626 m)   Wt 153 lb (69.4 kg)    LMP 05/08/2008 (Approximate)   SpO2 96%   BMI 26.26 kg/m  Wt Readings from Last 3 Encounters:  11/10/21 153 lb (69.4 kg)  02/15/21 163 lb (73.9 kg)  08/13/20 142 lb 2 oz (64.5 kg)    Diabetic  Foot Exam - Simple   No data filed    Lab Results  Component Value Date   WBC 9.9 05/26/2020   HGB 14.3 05/26/2020   HCT 40.4 05/26/2020   PLT 317.0 05/26/2020   GLUCOSE 345 (H) 02/15/2021   CHOL 349 (H) 02/15/2021   TRIG 232.0 (H) 02/15/2021   HDL 57.30 02/15/2021   LDLDIRECT 258.0 02/15/2021   LDLCALC 118 (H) 05/26/2020   ALT 13 02/15/2021   AST 13 02/15/2021   NA 135 02/15/2021   K 4.3 02/15/2021   CL 94 (L) 02/15/2021   CREATININE 0.57 02/15/2021   BUN 12 02/15/2021   CO2 31 02/15/2021   TSH 3.69 02/15/2021   HGBA1C 12.6 (H) 02/15/2021   MICROALBUR 3.0 (H) 06/24/2014    Lab Results  Component Value Date   TSH 3.69 02/15/2021   Lab Results  Component Value Date   WBC 9.9 05/26/2020   HGB 14.3 05/26/2020   HCT 40.4 05/26/2020   MCV 87.4 05/26/2020   PLT 317.0 05/26/2020   Lab Results  Component Value Date   NA 135 02/15/2021   K 4.3 02/15/2021   CO2 31 02/15/2021   GLUCOSE 345 (H) 02/15/2021   BUN 12 02/15/2021   CREATININE 0.57 02/15/2021   BILITOT 0.6 02/15/2021   ALKPHOS 139 (H) 02/15/2021   AST 13 02/15/2021   ALT 13 02/15/2021   PROT 7.1 02/15/2021   ALBUMIN 4.1 02/15/2021   CALCIUM 9.5 02/15/2021   GFR 99.40 02/15/2021   Lab Results  Component Value Date   CHOL 349 (H) 02/15/2021   Lab Results  Component Value Date   HDL 57.30 02/15/2021   Lab Results  Component Value Date   LDLCALC 118 (H) 05/26/2020   Lab Results  Component Value Date   TRIG 232.0 (H) 02/15/2021   Lab Results  Component Value Date   CHOLHDL 6 02/15/2021   Lab Results  Component Value Date   HGBA1C 12.6 (H) 02/15/2021       Assessment & Plan:   Problem List Items Addressed This Visit     Hypothyroidism    On Levothyroxine, continue to monitor       Relevant Medications   venlafaxine XR (EFFEXOR-XR) 75 MG 24 hr capsule   levothyroxine (SYNTHROID) 200 MCG tablet   Hyperlipidemia, mixed    Tolerating statin, encouraged heart healthy diet, avoid trans fats, minimize simple carbs and saturated fats. Increase exercise as tolerated      Relevant Medications   lisinopril (ZESTRIL) 5 MG tablet   atorvastatin (LIPITOR) 10 MG tablet   Essential hypertension    Well controlled, no changes to meds. Encouraged heart healthy diet such as the DASH diet and exercise as tolerated.       Relevant Medications   lisinopril (ZESTRIL) 5 MG tablet   atorvastatin (LIPITOR) 10 MG tablet   venlafaxine XR (EFFEXOR-XR) 75 MG 24 hr capsule   Other Relevant Orders   Comprehensive metabolic panel   CBC   TRANSAMINASES, SERUM, ELEVATED   Relevant Medications   venlafaxine XR (EFFEXOR-XR) 75 MG 24 hr capsule   Vitamin D deficiency    Supplement and monitor      DKA (diabetic ketoacidosis) ([place])    Was hospitalized in April with DKA and is here today to complete paperwork for her ST disability related to that episode. She went back to work in May but has since retired. Paperwork completed.       Relevant Medications   lisinopril (ZESTRIL)  5 MG tablet   atorvastatin (LIPITOR) 10 MG tablet   metFORMIN (GLUCOPHAGE) 1000 MG tablet   UTI symptoms    Check UA and culture      Relevant Orders   Urinalysis   Urine Culture   RESOLVED: Diabetes type 2, uncontrolled - Primary   Relevant Medications   lisinopril (ZESTRIL) 5 MG tablet   atorvastatin (LIPITOR) 10 MG tablet   metFORMIN (GLUCOPHAGE) 1000 MG tablet   Other Relevant Orders   Hemoglobin A1c   Microalbumin / creatinine urine ratio    I have discontinued Lattie Haw T. Kilgour's glimepiride. I have also changed her lisinopril, metFORMIN, and levothyroxine. Additionally, I am having her maintain her aspirin, ferrous sulfate, BLACK COHOSH PO, UNABLE TO FIND, MELATONIN PO, potassium chloride, famotidine,  atorvastatin, and venlafaxine XR.  Meds ordered this encounter  Medications   lisinopril (ZESTRIL) 5 MG tablet    Sig: Take 1 tablet (5 mg total) by mouth daily.    Dispense:  90 tablet    Refill:  1   atorvastatin (LIPITOR) 10 MG tablet    Sig: Take 1 tablet (10 mg total) by mouth daily.    Dispense:  90 tablet    Refill:  1   metFORMIN (GLUCOPHAGE) 1000 MG tablet    Sig: Take 1 tablet (1,000 mg total) by mouth 2 (two) times daily with a meal.    Dispense:  180 tablet    Refill:  1   venlafaxine XR (EFFEXOR-XR) 75 MG 24 hr capsule    Sig: TAKE 1 CAPSULE BY MOUTH IN THE MORNING, AT NOON AND AT BEDTIME.    Dispense:  270 capsule    Refill:  1   levothyroxine (SYNTHROID) 200 MCG tablet    Sig: Take 1 tablet (200 mcg total) by mouth daily before breakfast.    Dispense:  90 tablet    Refill:  1     Penni Homans, MD

## 2021-11-11 NOTE — Assessment & Plan Note (Signed)
Check UA and culture 

## 2021-11-11 NOTE — Assessment & Plan Note (Signed)
Supplement and monitor 

## 2021-11-11 NOTE — Assessment & Plan Note (Signed)
On Levothyroxine, continue to monitor 

## 2021-11-11 NOTE — Assessment & Plan Note (Signed)
hgba1c acceptable, minimize simple carbs. Increase exercise as tolerated. Continue current meds 

## 2021-12-05 ENCOUNTER — Other Ambulatory Visit: Payer: PRIVATE HEALTH INSURANCE

## 2022-05-29 ENCOUNTER — Encounter: Payer: PRIVATE HEALTH INSURANCE | Admitting: Family Medicine

## 2022-06-20 ENCOUNTER — Telehealth: Payer: Self-pay

## 2022-07-07 NOTE — Transitions of Care (Post Inpatient/ED Visit) (Unsigned)
   06/17/2022  Name: YAHAYRA GEIS MRN: [id number] DOB: 05/18/1960  Today's TOC FU Call Status:    Attempted to reach the patient regarding the most recent Inpatient/ED visit.  Follow Up Plan: Additional outreach attempts will be made to reach the patient to complete the Transitions of Care (Post Inpatient/ED visit) call.   Signature  Juanda Crumble, [place] Direct Dial [phone]

## 2022-07-07 DEATH — deceased
